# Patient Record
Sex: Female | Born: 1950 | Race: Black or African American | Hispanic: No | Marital: Single | Smoking: Former smoker
Health system: Southern US, Community
[De-identification: ages and names within clinical notes are randomized; demographics above are authoritative.]

## PROBLEM LIST (undated history)

## (undated) DIAGNOSIS — Z8619 Personal history of other infectious and parasitic diseases: Secondary | ICD-10-CM

## (undated) DIAGNOSIS — G4730 Sleep apnea, unspecified: Secondary | ICD-10-CM

## (undated) DIAGNOSIS — M150 Primary generalized (osteo)arthritis: Secondary | ICD-10-CM

## (undated) DIAGNOSIS — F418 Other specified anxiety disorders: Secondary | ICD-10-CM

## (undated) DIAGNOSIS — M25561 Pain in right knee: Secondary | ICD-10-CM

## (undated) DIAGNOSIS — D649 Anemia, unspecified: Secondary | ICD-10-CM

## (undated) DIAGNOSIS — F32A Depression, unspecified: Secondary | ICD-10-CM

## (undated) DIAGNOSIS — Z6841 Body Mass Index (BMI) 40.0 and over, adult: Secondary | ICD-10-CM

## (undated) DIAGNOSIS — H409 Unspecified glaucoma: Secondary | ICD-10-CM

## (undated) DIAGNOSIS — M159 Polyosteoarthritis, unspecified: Secondary | ICD-10-CM

## (undated) DIAGNOSIS — I509 Heart failure, unspecified: Secondary | ICD-10-CM

## (undated) DIAGNOSIS — F419 Anxiety disorder, unspecified: Secondary | ICD-10-CM

## (undated) DIAGNOSIS — F329 Major depressive disorder, single episode, unspecified: Secondary | ICD-10-CM

## (undated) DIAGNOSIS — M8949 Other hypertrophic osteoarthropathy, multiple sites: Secondary | ICD-10-CM

## (undated) DIAGNOSIS — R7301 Impaired fasting glucose: Secondary | ICD-10-CM

## (undated) DIAGNOSIS — I10 Essential (primary) hypertension: Secondary | ICD-10-CM

## (undated) DIAGNOSIS — M25562 Pain in left knee: Secondary | ICD-10-CM

## (undated) DIAGNOSIS — B182 Chronic viral hepatitis C: Secondary | ICD-10-CM

## (undated) HISTORY — DX: Pain in left knee: M25.561

## (undated) HISTORY — DX: Primary generalized (osteo)arthritis: M15.0

## (undated) HISTORY — DX: Morbid (severe) obesity due to excess calories: E66.01

## (undated) HISTORY — DX: Chronic viral hepatitis C: B18.2

## (undated) HISTORY — DX: Other specified anxiety disorders: F41.8

## (undated) HISTORY — DX: Anemia, unspecified: D64.9

## (undated) HISTORY — DX: Other hypertrophic osteoarthropathy, multiple sites: M89.49

## (undated) HISTORY — PX: BREAST BIOPSY: SHX20

## (undated) HISTORY — DX: Anxiety disorder, unspecified: F41.9

## (undated) HISTORY — DX: Pain in left knee: M25.562

## (undated) HISTORY — DX: Unspecified glaucoma: H40.9

## (undated) HISTORY — DX: Major depressive disorder, single episode, unspecified: F32.9

## (undated) HISTORY — DX: Impaired fasting glucose: R73.01

## (undated) HISTORY — DX: Depression, unspecified: F32.A

## (undated) HISTORY — DX: Polyosteoarthritis, unspecified: M15.9

## (undated) HISTORY — DX: Personal history of other infectious and parasitic diseases: Z86.19

## (undated) HISTORY — DX: Body Mass Index (BMI) 40.0 and over, adult: Z684

---

## 1997-06-06 DIAGNOSIS — I509 Heart failure, unspecified: Secondary | ICD-10-CM

## 1997-06-06 HISTORY — DX: Heart failure, unspecified: I50.9

## 1999-04-07 HISTORY — PX: ABDOMINAL HYSTERECTOMY: SHX81

## 2005-01-02 ENCOUNTER — Emergency Department: Payer: Self-pay | Admitting: Unknown Physician Specialty

## 2005-01-18 ENCOUNTER — Emergency Department: Payer: Self-pay | Admitting: Emergency Medicine

## 2005-01-19 ENCOUNTER — Emergency Department: Payer: Self-pay | Admitting: Emergency Medicine

## 2005-08-27 ENCOUNTER — Emergency Department: Payer: Self-pay | Admitting: Emergency Medicine

## 2005-11-23 ENCOUNTER — Ambulatory Visit: Payer: Self-pay

## 2006-03-08 ENCOUNTER — Other Ambulatory Visit: Payer: Self-pay

## 2006-03-08 ENCOUNTER — Ambulatory Visit: Payer: Self-pay

## 2006-06-19 ENCOUNTER — Emergency Department: Payer: Self-pay | Admitting: Emergency Medicine

## 2007-03-28 ENCOUNTER — Ambulatory Visit: Payer: Self-pay

## 2008-04-15 ENCOUNTER — Ambulatory Visit: Payer: Self-pay

## 2009-11-10 ENCOUNTER — Ambulatory Visit: Payer: Self-pay | Admitting: Orthopedic Surgery

## 2009-11-12 ENCOUNTER — Ambulatory Visit: Payer: Self-pay | Admitting: Orthopedic Surgery

## 2009-11-18 ENCOUNTER — Ambulatory Visit: Payer: Self-pay

## 2009-12-08 ENCOUNTER — Ambulatory Visit: Payer: Self-pay | Admitting: General Surgery

## 2009-12-17 ENCOUNTER — Ambulatory Visit: Payer: Self-pay | Admitting: General Surgery

## 2010-05-17 ENCOUNTER — Emergency Department: Payer: Self-pay | Admitting: Emergency Medicine

## 2010-12-14 ENCOUNTER — Ambulatory Visit: Payer: Self-pay

## 2011-04-05 DIAGNOSIS — I5022 Chronic systolic (congestive) heart failure: Secondary | ICD-10-CM | POA: Insufficient documentation

## 2011-06-20 ENCOUNTER — Ambulatory Visit: Payer: Self-pay

## 2011-06-28 ENCOUNTER — Ambulatory Visit: Payer: Self-pay

## 2011-07-28 ENCOUNTER — Ambulatory Visit: Payer: Self-pay | Admitting: General Surgery

## 2011-07-28 LAB — CBC WITH DIFFERENTIAL/PLATELET
Basophil #: 0 10*3/uL (ref 0.0–0.1)
Basophil %: 0.5 %
Eosinophil #: 0.3 10*3/uL (ref 0.0–0.7)
Eosinophil %: 4.7 %
HCT: 36.7 % (ref 35.0–47.0)
Lymphocyte #: 2 10*3/uL (ref 1.0–3.6)
Lymphocyte %: 35.1 %
MCHC: 30.6 g/dL — ABNORMAL LOW (ref 32.0–36.0)
MCV: 79 fL — ABNORMAL LOW (ref 80–100)
Monocyte %: 12.9 %
Neutrophil #: 2.6 10*3/uL (ref 1.4–6.5)
RBC: 4.66 10*6/uL (ref 3.80–5.20)
RDW: 18.4 % — ABNORMAL HIGH (ref 11.5–14.5)

## 2011-07-28 LAB — BASIC METABOLIC PANEL
Anion Gap: 12 (ref 7–16)
BUN: 24 mg/dL — ABNORMAL HIGH (ref 7–18)
Creatinine: 1.16 mg/dL (ref 0.60–1.30)
EGFR (African American): >60
EGFR (Non-African Amer.): 51 — ABNORMAL LOW
Glucose: 106 mg/dL — ABNORMAL HIGH (ref 65–99)
Osmolality: 288 (ref 275–301)
Potassium: 4.1 mmol/L (ref 3.5–5.1)

## 2011-08-09 ENCOUNTER — Ambulatory Visit: Payer: Self-pay | Admitting: General Surgery

## 2012-01-16 DIAGNOSIS — M1991 Primary osteoarthritis, unspecified site: Secondary | ICD-10-CM | POA: Insufficient documentation

## 2012-06-03 ENCOUNTER — Emergency Department: Payer: Self-pay | Admitting: Emergency Medicine

## 2012-06-03 LAB — BASIC METABOLIC PANEL
Calcium, Total: 9.3 mg/dL (ref 8.5–10.1)
Chloride: 107 mmol/L (ref 98–107)
Co2: 29 mmol/L (ref 21–32)
Creatinine: 1.12 mg/dL (ref 0.60–1.30)
Potassium: 4.1 mmol/L (ref 3.5–5.1)
Sodium: 142 mmol/L (ref 136–145)

## 2012-06-03 LAB — CBC
HCT: 36.6 % (ref 35.0–47.0)
HGB: 12.2 g/dL (ref 12.0–16.0)
MCHC: 33.2 g/dL (ref 32.0–36.0)
MCV: 87 fL (ref 80–100)
RDW: 16.3 % — ABNORMAL HIGH (ref 11.5–14.5)
WBC: 6.6 10*3/uL (ref 3.6–11.0)

## 2012-06-03 LAB — CK TOTAL AND CKMB (NOT AT ARMC): CK-MB: 1.1 ng/mL (ref 0.5–3.6)

## 2012-06-04 ENCOUNTER — Emergency Department: Payer: Self-pay | Admitting: Emergency Medicine

## 2012-06-04 LAB — CBC
HGB: 11.6 g/dL — ABNORMAL LOW (ref 12.0–16.0)
RBC: 4.18 10*6/uL (ref 3.80–5.20)

## 2012-06-04 LAB — BASIC METABOLIC PANEL
Chloride: 108 mmol/L — ABNORMAL HIGH (ref 98–107)
Co2: 29 mmol/L (ref 21–32)
EGFR (African American): 59 — ABNORMAL LOW

## 2012-06-04 LAB — TROPONIN I: Troponin-I: <0.02 ng/mL

## 2012-06-04 LAB — CK TOTAL AND CKMB (NOT AT ARMC): CK-MB: 0.6 ng/mL (ref 0.5–3.6)

## 2012-06-04 LAB — PRO B NATRIURETIC PEPTIDE: B-Type Natriuretic Peptide: 344 pg/mL — ABNORMAL HIGH (ref 0–125)

## 2012-10-09 ENCOUNTER — Ambulatory Visit: Payer: Self-pay

## 2012-10-30 DIAGNOSIS — J309 Allergic rhinitis, unspecified: Secondary | ICD-10-CM | POA: Insufficient documentation

## 2012-10-30 DIAGNOSIS — K219 Gastro-esophageal reflux disease without esophagitis: Secondary | ICD-10-CM | POA: Insufficient documentation

## 2012-11-14 ENCOUNTER — Emergency Department: Payer: Self-pay | Admitting: Emergency Medicine

## 2012-11-14 LAB — URINALYSIS, COMPLETE
Ketone: NEGATIVE
Nitrite: NEGATIVE
Specific Gravity: 1.021 (ref 1.003–1.030)

## 2012-11-14 LAB — BASIC METABOLIC PANEL
BUN: 16 mg/dL (ref 7–18)
Calcium, Total: 9.8 mg/dL (ref 8.5–10.1)
Chloride: 105 mmol/L (ref 98–107)
Co2: 29 mmol/L (ref 21–32)
Creatinine: 0.95 mg/dL (ref 0.60–1.30)
EGFR (African American): >60
Glucose: 110 mg/dL — ABNORMAL HIGH (ref 65–99)
Potassium: 4.5 mmol/L (ref 3.5–5.1)

## 2012-11-14 LAB — CBC
HGB: 12.3 g/dL (ref 12.0–16.0)
MCV: 81 fL (ref 80–100)
Platelet: 299 10*3/uL (ref 150–440)
RDW: 19.3 % — ABNORMAL HIGH (ref 11.5–14.5)
WBC: 5.6 10*3/uL (ref 3.6–11.0)

## 2013-01-03 ENCOUNTER — Ambulatory Visit: Payer: Self-pay | Admitting: Bariatrics

## 2013-01-03 LAB — COMPREHENSIVE METABOLIC PANEL
Alkaline Phosphatase: 77 U/L (ref 50–136)
Anion Gap: 3 — ABNORMAL LOW (ref 7–16)
BUN: 14 mg/dL (ref 7–18)
Bilirubin,Total: 0.3 mg/dL (ref 0.2–1.0)
Calcium, Total: 9.3 mg/dL (ref 8.5–10.1)
Chloride: 107 mmol/L (ref 98–107)
EGFR (Non-African Amer.): >60
Glucose: 101 mg/dL — ABNORMAL HIGH (ref 65–99)
Osmolality: 278 (ref 275–301)
SGOT(AST): 38 U/L — ABNORMAL HIGH (ref 15–37)
SGPT (ALT): 52 U/L (ref 12–78)

## 2013-01-03 LAB — BILIRUBIN, DIRECT: Bilirubin, Direct: 0.2 mg/dL (ref 0.00–0.20)

## 2013-01-03 LAB — MAGNESIUM: Magnesium: 1.8 mg/dL

## 2013-01-03 LAB — FERRITIN: Ferritin (ARMC): 74 ng/mL (ref 8–388)

## 2013-01-03 LAB — CBC WITH DIFFERENTIAL/PLATELET
Basophil %: 1 %
Eosinophil %: 5 %
HCT: 36.3 % (ref 35.0–47.0)
Lymphocyte #: 1.6 10*3/uL (ref 1.0–3.6)
Lymphocyte %: 31.7 %
MCH: 26.9 pg (ref 26.0–34.0)
MCHC: 32.9 g/dL (ref 32.0–36.0)
Monocyte #: 0.5 x10 3/mm (ref 0.2–0.9)
Monocyte %: 10.4 %
Neutrophil #: 2.7 10*3/uL (ref 1.4–6.5)
Platelet: 282 10*3/uL (ref 150–440)
RBC: 4.44 10*6/uL (ref 3.80–5.20)
RDW: 18.2 % — ABNORMAL HIGH (ref 11.5–14.5)
WBC: 5.1 10*3/uL (ref 3.6–11.0)

## 2013-01-03 LAB — FOLATE: Folic Acid: 13.4 ng/mL (ref 3.1–100.0)

## 2013-01-03 LAB — PROTIME-INR: Prothrombin Time: 13.3 secs (ref 11.5–14.7)

## 2013-01-03 LAB — IRON AND TIBC
Iron Bind.Cap.(Total): 386 ug/dL (ref 250–450)
Iron Saturation: 12 %
Iron: 48 ug/dL — ABNORMAL LOW (ref 50–170)
Unbound Iron-Bind.Cap.: 338 ug/dL

## 2013-01-03 LAB — PHOSPHORUS: Phosphorus: 3.3 mg/dL (ref 2.5–4.9)

## 2013-01-03 LAB — APTT: Activated PTT: 30 secs (ref 23.6–35.9)

## 2013-01-03 LAB — HEMOGLOBIN A1C: Hemoglobin A1C: 5.8 % (ref 4.2–6.3)

## 2013-01-14 ENCOUNTER — Other Ambulatory Visit: Payer: Self-pay | Admitting: Bariatrics

## 2013-01-14 DIAGNOSIS — K219 Gastro-esophageal reflux disease without esophagitis: Secondary | ICD-10-CM

## 2013-01-14 DIAGNOSIS — E669 Obesity, unspecified: Secondary | ICD-10-CM

## 2013-01-14 DIAGNOSIS — I10 Essential (primary) hypertension: Secondary | ICD-10-CM

## 2013-01-21 ENCOUNTER — Other Ambulatory Visit: Payer: Self-pay

## 2013-01-22 ENCOUNTER — Other Ambulatory Visit: Payer: Self-pay

## 2013-02-18 ENCOUNTER — Ambulatory Visit: Payer: Self-pay | Admitting: Bariatrics

## 2013-03-06 ENCOUNTER — Ambulatory Visit: Payer: Self-pay | Admitting: Bariatrics

## 2013-05-21 ENCOUNTER — Ambulatory Visit: Payer: Self-pay | Admitting: Gastroenterology

## 2013-05-27 LAB — PATHOLOGY REPORT

## 2013-06-01 ENCOUNTER — Emergency Department: Payer: Self-pay | Admitting: Emergency Medicine

## 2013-06-01 LAB — CBC
HCT: 35.3 % (ref 35.0–47.0)
HGB: 11.5 g/dL — ABNORMAL LOW (ref 12.0–16.0)
MCH: 26 pg (ref 26.0–34.0)
MCV: 80 fL (ref 80–100)
RBC: 4.44 10*6/uL (ref 3.80–5.20)

## 2013-06-02 LAB — COMPREHENSIVE METABOLIC PANEL
BUN: 18 mg/dL (ref 7–18)
Bilirubin,Total: 0.2 mg/dL (ref 0.2–1.0)
Calcium, Total: 9.3 mg/dL (ref 8.5–10.1)
Chloride: 103 mmol/L (ref 98–107)
Co2: 29 mmol/L (ref 21–32)
Creatinine: 1.4 mg/dL — ABNORMAL HIGH (ref 0.60–1.30)
EGFR (African American): 47 — ABNORMAL LOW
EGFR (Non-African Amer.): 41 — ABNORMAL LOW
Glucose: 107 mg/dL — ABNORMAL HIGH (ref 65–99)
Osmolality: 272 (ref 275–301)
Potassium: 4.5 mmol/L (ref 3.5–5.1)
SGOT(AST): 38 U/L — ABNORMAL HIGH (ref 15–37)
SGPT (ALT): 43 U/L (ref 12–78)
Sodium: 135 mmol/L — ABNORMAL LOW (ref 136–145)

## 2013-06-02 LAB — CK TOTAL AND CKMB (NOT AT ARMC): CK, Total: 242 U/L — ABNORMAL HIGH (ref 21–215)

## 2013-12-30 ENCOUNTER — Ambulatory Visit: Payer: Self-pay | Admitting: Gastroenterology

## 2014-09-28 NOTE — Op Note (Signed)
PATIENT NAME:  Angie ChampagneLONG, [name] MR#:  [id number] DATE OF BIRTH:  10/09/1950  DATE OF PROCEDURE:  08/09/2011  PREOPERATIVE DIAGNOSIS: Right breast microcalcifications.   POSTOPERATIVE DIAGNOSIS: Right breast microcalcifications.   OPERATION: Excisional biopsy right breast after wire localization with mammography.   SURGEON:  Kathreen CosierS. G. Kaleiah Kutzer, M.D.   ANESTHESIA: Monitored care with local anesthetic of  0.5% Marcaine mixed with 1% Xylocaine.   COMPLICATIONS: None.   ESTIMATED BLOOD LOSS: Less than 20 mL.   DRAINS: None.   DESCRIPTION OF PROCEDURE: The patient was placed in the supine position on the operating table. The right breast was prepped and draped out as a sterile field, taking care not to disturb the wire that was placed with mammography to localize an area of calcifications located close to the areola in the upper outer quadrant. A skin incision was mapped out below the level of the wire entrance placed slightly superior to the area of the lesion. A local anesthetic was then instilled and adequate block was obtained in this area. A skin incision was made and carefully deepened through into the subcutaneous tissue. The skin and subcutaneous tissue were elevated on both sides and the entrance of the wire to the glandular tissue was identified. The wire was freed from the skin. With the wire as a guide, dissection was then performed surrounding this until the thick part of the wire was encountered where the microcalcifications were identified. In here the glandular tissue took on a very hard feel, measuring about 3 to 4 cm in diameter. This entire area was excised out including a portion beyond the tip of the wire. After excision there was another portion located just lateral that was also felt to be somewhat hard. This was about 3 x 1-cm tissue and this was excised out and also sent with the main specimen to mammography. Specimen mammogram confirmed the presence of the microcalcifications and the  tissue was then sent to pathology. Hemostasis was obtained with cautery. The deeper tissues were then closed with 2-0 Vicryl in two layers, and the skin was approximated with subcuticular 4-0 Vicryl covered with Dermabond. The procedure was well tolerated. The patient subsequently was returned to the recovery room in stable condition.    ____________________________ S.[name]. [name], MD sgs:bjt D: 08/09/2011 16:20:50 ET T: 08/09/2011 16:52:17 ET JOB#: [id number]  cc: Timoteo ExposeS.G. Evette CristalSankar, MD, <Dictator> [facility] Center[facility]PL[facility] [name] Kariss Longmire MD ELECTRONICALLY SIGNED 08/10/2011 7:11

## 2014-12-22 ENCOUNTER — Other Ambulatory Visit: Payer: Self-pay | Admitting: Family Medicine

## 2014-12-22 ENCOUNTER — Telehealth: Payer: Self-pay | Admitting: Family Medicine

## 2014-12-22 NOTE — Telephone Encounter (Signed)
PT SAID THAT SHE NEEDS A NOTE ABOUT NOT BEING ABLE TO SERVE ON JURY DUTY. SHE SAID THAT SHE CAN NOT SET THAT [place] WITH HER FEET HANGING FOR THEY SWELL SO BAD PLUS SHE HAS TO WEAR DIAPERS AT ALL TIMES DUE TO LEAKAGE EVEN DOING NORMAL ACTI VITES.

## 2014-12-22 NOTE — Telephone Encounter (Signed)
PT WAS NOTIFIED AND WILL COME BY ON 12-23-14 TO PICK IT UP.

## 2014-12-22 NOTE — Telephone Encounter (Signed)
Requested letter printed out and ready for pick up please let patient know thank you.

## 2015-01-08 ENCOUNTER — Ambulatory Visit: Payer: Self-pay | Admitting: Family Medicine

## 2015-01-15 ENCOUNTER — Telehealth: Payer: Self-pay | Admitting: Family Medicine

## 2015-01-15 NOTE — Telephone Encounter (Signed)
You had written a letter on 12-22-14 for jury duty. She has received another letter asking asking additional information. Patient is requesting that you please add on the letter about her fluid pills and her having a walking and having to go to the restroom all the time. They are wanting her to serve jury duty on sept 26, 2016.  [phone]

## 2015-01-16 ENCOUNTER — Telehealth: Payer: Self-pay

## 2015-01-16 NOTE — Telephone Encounter (Signed)
Tried to contact this patient to inform her that the letter that was written on 12/22/14 should be sufficient and that we are not allowed to give out specifics regarding her medical history due to HIPAA, but there was no answer. A message was left for her to give [place] a call back when she got the chance.

## 2015-01-16 NOTE — Telephone Encounter (Signed)
If they have already declined accepting my initial letter to excuse her from jury duty there is not much more I can do to excuse her in her legal obligations.

## 2015-01-16 NOTE — Telephone Encounter (Signed)
Patient stated that the they mailed the letter back to her and the sheriff stated that it need to be specific why she cannot complete her time for jury duty. Patient was informed that we could not go into specifics due to HIPAA and that the letter should cover what they need and if they needed to hear that from [place] to give [place] a call. She said ok and thanks

## 2015-01-26 ENCOUNTER — Other Ambulatory Visit: Payer: Self-pay | Admitting: Family Medicine

## 2015-01-26 ENCOUNTER — Telehealth: Payer: Self-pay | Admitting: Family Medicine

## 2015-01-26 ENCOUNTER — Telehealth: Payer: Self-pay

## 2015-01-26 DIAGNOSIS — I10 Essential (primary) hypertension: Secondary | ICD-10-CM

## 2015-01-26 MED ORDER — LISINOPRIL 20 MG PO TABS: 20.0000 mg | ORAL_TABLET | Freq: Every day | ORAL | Status: DC

## 2015-01-26 NOTE — Telephone Encounter (Signed)
Pt would like a referral to Orthopedic dr at [facility]. Pt has Medicaid.

## 2015-01-26 NOTE — Telephone Encounter (Signed)
Rx sent 

## 2015-01-26 NOTE — Telephone Encounter (Signed)
Got a fax from [facility] requesting a 90 day supply of Lisinopril   Refill request was sent to Dr. Edwena Felty for approval and submission.  Lisinopril (  Tablet, Oral) Active. Recorded 10/07/2014 09:33 AM by Idelle Crouch, Office Visit.

## 2015-01-26 NOTE — Telephone Encounter (Signed)
Needs office visit.

## 2015-01-27 NOTE — Telephone Encounter (Signed)
LMOM to inform pt to call the office to schedule an appt °

## 2015-02-06 ENCOUNTER — Ambulatory Visit: Payer: Self-pay | Admitting: Family Medicine

## 2015-02-13 ENCOUNTER — Ambulatory Visit: Payer: Self-pay | Admitting: Family Medicine

## 2015-02-18 ENCOUNTER — Other Ambulatory Visit: Payer: Self-pay

## 2015-02-18 DIAGNOSIS — F4312 Post-traumatic stress disorder, chronic: Secondary | ICD-10-CM | POA: Insufficient documentation

## 2015-02-18 DIAGNOSIS — F419 Anxiety disorder, unspecified: Secondary | ICD-10-CM

## 2015-02-18 DIAGNOSIS — I10 Essential (primary) hypertension: Secondary | ICD-10-CM | POA: Insufficient documentation

## 2015-02-18 DIAGNOSIS — D649 Anemia, unspecified: Secondary | ICD-10-CM | POA: Insufficient documentation

## 2015-02-18 DIAGNOSIS — G4733 Obstructive sleep apnea (adult) (pediatric): Secondary | ICD-10-CM | POA: Insufficient documentation

## 2015-02-18 DIAGNOSIS — Z8619 Personal history of other infectious and parasitic diseases: Secondary | ICD-10-CM

## 2015-02-18 DIAGNOSIS — H409 Unspecified glaucoma: Secondary | ICD-10-CM | POA: Insufficient documentation

## 2015-02-18 DIAGNOSIS — F329 Major depressive disorder, single episode, unspecified: Secondary | ICD-10-CM | POA: Insufficient documentation

## 2015-02-18 DIAGNOSIS — T7840XA Allergy, unspecified, initial encounter: Secondary | ICD-10-CM | POA: Insufficient documentation

## 2015-02-18 DIAGNOSIS — Z789 Other specified health status: Secondary | ICD-10-CM | POA: Insufficient documentation

## 2015-02-18 DIAGNOSIS — M174 Other bilateral secondary osteoarthritis of knee: Secondary | ICD-10-CM | POA: Insufficient documentation

## 2015-02-18 DIAGNOSIS — M25569 Pain in unspecified knee: Secondary | ICD-10-CM | POA: Insufficient documentation

## 2015-02-18 DIAGNOSIS — Z6841 Body Mass Index (BMI) 40.0 and over, adult: Secondary | ICD-10-CM

## 2015-02-18 HISTORY — DX: Personal history of other infectious and parasitic diseases: Z86.19

## 2015-02-18 NOTE — Telephone Encounter (Signed)
Refill request was sent to Dr. Ashany Sundaram for approval and submission.  

## 2015-02-23 ENCOUNTER — Telehealth: Payer: Self-pay | Admitting: Family Medicine

## 2015-02-23 NOTE — Telephone Encounter (Signed)
She has only seen me once 10/07/14 to establish care. Medical Insurances require all new orders especially home care, home health, specialist referrals, etc to be documented in an office visit prior to the order being approved. MUST follow up with me.

## 2015-02-23 NOTE — Telephone Encounter (Signed)
A message was left for the patient stating that in order to get what she is requesting, which is the wheelchair, she will have to come in for an office visit, but there was no answer. A message was left for her stating that an office visit with Dr. Sherley Bounds will be the only way that an order for a wheelchair would be submitted and if she has any other questions to give [place] a call.

## 2015-02-23 NOTE — Telephone Encounter (Signed)
Pt is needing to have some assistance in her home until she can get her wheel chair.  Pt wanted to know what she needed to do in order to get assistance.

## 2015-02-23 NOTE — Telephone Encounter (Signed)
Patient called back inquiring about an order for a wheelchair. I informed her that you would like for her to be seen. She began crying and stated that her knees gave out on her Saturday and states that she is not mobile that she is not able to walk at all. Stated that she would come in but she cannot walk. She is pleading for your assistance. I told her that again what you said and that I would ask you again.

## 2015-02-23 NOTE — Telephone Encounter (Signed)
Needs office visit.

## 2015-02-24 ENCOUNTER — Telehealth: Payer: Self-pay

## 2015-02-24 NOTE — Telephone Encounter (Signed)
Patient called stating she just need a wheelchair so she can get an aid to help her get downstairs so she can get to her appointments in [place] for her shots. She stated that she has not been able to do anything doesn't have anyone to help her. I then informed her that with her insurance Medicaid and Medicare, they will not approve anything unless they have documentation and that is something we do not currently have on her, b/c she has not been seen since 2015. I went on to say that she could call the paramedics to see if they could get her downstairs and to her appts so that we can document everything and then get her the help that she needs but she was not trying to hear anything that I was saying b/c she kept repeating "you don't understand I don't have anyone to help me." She got upset and said, "ok," then hung up.

## 2015-02-25 ENCOUNTER — Other Ambulatory Visit: Payer: Self-pay | Admitting: Family Medicine

## 2015-02-25 MED ORDER — NYSTATIN 100000 UNIT/GM EX CREA: TOPICAL_CREAM | Freq: Two times a day (BID) | CUTANEOUS | Status: DC

## 2015-02-25 NOTE — Telephone Encounter (Signed)
PT NEEDS REFILL ON NYSTATIN. PT SAID THAT PHARM SAID THEY HAVE SENT IT OVER A COUPLE TIMES. PHARM IS [facility]

## 2015-02-25 NOTE — Telephone Encounter (Signed)
Refill request was sent to Dr. Ashany Sundaram for approval and submission.  

## 2015-03-02 ENCOUNTER — Telehealth: Payer: Self-pay

## 2015-03-02 NOTE — Telephone Encounter (Signed)
Patient called wanting to know if she was able to get someone to help her get into her car and she drove over here to [facility], would someone be able to help her get into our facility and I told her yes. She stated she would call when she got here.

## 2015-03-03 ENCOUNTER — Encounter: Payer: Self-pay | Admitting: Family Medicine

## 2015-03-03 ENCOUNTER — Ambulatory Visit (INDEPENDENT_AMBULATORY_CARE_PROVIDER_SITE_OTHER): Payer: Medicare Other | Admitting: Family Medicine

## 2015-03-03 VITALS — BP 122/68 | HR 68 | Temp 97.7°F | Resp 16 | Ht 64.0 in | Wt 316.9 lb

## 2015-03-03 DIAGNOSIS — M174 Other bilateral secondary osteoarthritis of knee: Secondary | ICD-10-CM

## 2015-03-03 DIAGNOSIS — R29898 Other symptoms and signs involving the musculoskeletal system: Secondary | ICD-10-CM

## 2015-03-03 DIAGNOSIS — I5022 Chronic systolic (congestive) heart failure: Secondary | ICD-10-CM | POA: Diagnosis not present

## 2015-03-03 DIAGNOSIS — Z23 Encounter for immunization: Secondary | ICD-10-CM | POA: Diagnosis not present

## 2015-03-03 DIAGNOSIS — Z6841 Body Mass Index (BMI) 40.0 and over, adult: Secondary | ICD-10-CM | POA: Diagnosis not present

## 2015-03-03 DIAGNOSIS — I10 Essential (primary) hypertension: Secondary | ICD-10-CM

## 2015-03-03 MED ORDER — WHEELCHAIR MISC: 1.0000 | Freq: Every day | Status: AC

## 2015-03-03 NOTE — Progress Notes (Addendum)
Name: Angie Webb   MRN: [id number]    DOB: [birth year]   Date:03/03/2015       Progress Note  Subjective  Chief Complaint  Chief Complaint  Patient presents with  . Advice Only    patient is here to see if she can get motorized wheelchair and home health care aide  . Medication Refill    HPI  Angie Webb is a 64 year old female who is fairly new to my practice who is here today to discuss her ongoing issues with weakness in her legs due to chronic pain and instability of her knees. She reports since about 1 month ago she has had worsening knee instability which has rendered her incapable of using her walker any more. She reports no overt falls but has been scooting around her home in a office chair with wheels. She would like a home wheel chair to help propel her self around. Angie Webb reports no weakness in her upper extremities. She denies numbness, tingling or focal neurological deficits. In the past she has consulted with [facility] specialists who have advised her on weight loss prior to much need knee replacement surgery. 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 Recently she saw a commercial on TV for [employer] services in [place] where she is planning to go to in order to get knee injections that may help build up some cushion in her knees. She needs me to fill out a transportation form which will be faxed to me. Otherwise she reports being able to clean her home, cook, sweep, drove here today. But she would like assistance with tasks such as laundry, taking garbage out and checking mail as she is restricted in her mobility outside of the home.   First diagnosed with hypertension several years ago. Current anti-hypertension medication regimen includes dietary modification, weight management and Labetalol 100 mg a day, amlodipine 10 mg a day, clonidine 0.2 mg twice a day, lisinopril 20 mg a day, Torsemide 20  mg a day.  Patient is following physician recommended management. Not checking blood pressure outside of physician office. Associated symptoms do not include headache, dizziness, nausea, lower extremity swelling, worsening shortness of breath, chest pain, numbness. Associated medical conditions include Congestive Heart Failure, systolic, Class II, currently not in acute failure.   Active Ambulatory Problems    Diagnosis Date Noted  . Absolute anemia 02/18/2015  . Chronic hepatitis C 02/18/2015  . Anxiety and depression 02/18/2015  . Glaucoma 02/18/2015  . BP (high blood pressure) 02/18/2015  . Morbid obesity with BMI of 50.0-59.9, adult 02/18/2015  . Obstructive sleep apnea of adult 02/18/2015  . Chronic post-traumatic stress disorder 02/18/2015  . Other bilateral secondary osteoarthritis of knee 02/18/2015  . Allergic rhinitis 10/30/2012  . Chronic diastolic heart failure 04/05/2011  . Clinical depression 02/27/2009  . Acid reflux 10/30/2012  . Obstructive apnea 02/27/2009  . Idiopathic localized osteoarthropathy 01/16/2012  . Need for immunization against influenza 03/03/2015  . Weakness of both lower extremities 03/03/2015   Resolved Ambulatory Problems    Diagnosis Date Noted  . Gravida 2 para 2 02/18/2015  . Allergic state 02/18/2015  . H/O measles 02/18/2015  . Gonalgia 02/18/2015   Past Medical History  Diagnosis Date  . Chronic hepatitis C without hepatic coma   . Anemia   . Depression with anxiety   . Depression   . Anxiety   . Primary  osteoarthritis involving multiple joints   . Bilateral knee pain     Social History  Substance Use Topics  . Smoking status: Former Games developer  . Smokeless tobacco: Not on file     Comment: quit 2007 after 32yrs.  . Alcohol Use: No     Current outpatient prescriptions:  .  amLODipine (NORVASC) 10 MG tablet, , Disp: , Rfl:  .  celecoxib (CELEBREX) 200 MG capsule, Take 200 mg by mouth., Disp: , Rfl:  .  cloNIDine (CATAPRES) 0.2 MG  tablet, Take 1 tablet by mouth 2 (two) times daily., Disp: , Rfl:  .  fluticasone (FLONASE) 50 MCG/ACT nasal spray, 1 spray by Each Nare route daily., Disp: , Rfl:  .  labetalol (NORMODYNE) 100 MG tablet, Take by mouth., Disp: , Rfl:  .  latanoprost (XALATAN) 0.005 % ophthalmic solution, Apply to eye., Disp: , Rfl:  .  lisinopril (PRINIVIL,ZESTRIL) 20 MG tablet, Take 1 tablet (20 mg total) by mouth daily., Disp: 90 tablet, Rfl: 1 .  nystatin cream (MYCOSTATIN), Apply topically 2 (two) times daily., Disp: 30 g, Rfl: 3 .  omeprazole (PRILOSEC) 20 MG capsule, , Disp: , Rfl:  .  timolol (TIMOPTIC) 0.5 % ophthalmic solution, , Disp: , Rfl:  .  torsemide (DEMADEX) 20 MG tablet, , Disp: , Rfl:  .  Misc. Devices [facility]) MISC, 1 Device by Does not apply route daily., Disp: 1 each, Rfl: 0  Past Surgical History  Procedure Laterality Date  . Breast biopsy  6/11 & 6/13  . Abdominal hysterectomy  04/1999    total    Family History  Problem Relation Age of Onset  . Cancer Father     Allergies  Allergen Reactions  . Bee Venom     Bees     Review of Systems  CONSTITUTIONAL: No significant weight changes, fever, chills, weakness or fatigue.  HEENT:  - Eyes: No visual changes.  - Ears: No auditory changes. No pain.  - Nose: No sneezing, congestion, runny nose. - Throat: No sore throat. No changes in swallowing. SKIN: No rash or itching.  CARDIOVASCULAR: No chest pain, chest pressure or chest discomfort. No palpitations or edema.  RESPIRATORY: No shortness of breath, cough or sputum.  GASTROINTESTINAL: No anorexia, nausea, vomiting. No changes in bowel habits. No abdominal pain or blood.  GENITOURINARY: No dysuria. No frequency. No discharge.  NEUROLOGICAL: No headache, dizziness, syncope, paralysis, ataxia, numbness or tingling in the extremities. No memory changes. No change in bowel or bladder control.  MUSCULOSKELETAL: Chronic joint pain. No muscle pain. HEMATOLOGIC: No anemia,  bleeding or bruising.  LYMPHATICS: No enlarged lymph nodes.  PSYCHIATRIC: No change in mood. No change in sleep pattern.  ENDOCRINOLOGIC: No reports of sweating, cold or heat intolerance. No polyuria or polydipsia.     Objective  BP 122/68 mmHg  Pulse 68  Temp(Src) 97.7 F (36.5 C) (Oral)  Resp 16  Wt 316 lb 14.4 oz (143.745 kg)  SpO2 97% Body mass index is 54.37 kg/(m^2).   Filed Vitals:   03/03/15 0828  Height:  (1.626 m)  Weight: 316 lb 14.4 oz (143.745 kg)     Physical Exam  Constitutional: Patient is morbidly obese, sitting in large wheel chair and well-nourished. In no distress.  HEENT:  - Head: Normocephalic and atraumatic.  - Ears: Bilateral TMs gray, no erythema or effusion - Nose: Nasal mucosa moist - Mouth/Throat: Oropharynx is clear and moist. No tonsillar hypertrophy or erythema. No post nasal drainage.  -  Eyes: Conjunctivae clear, EOM movements normal. PERRLA. No scleral icterus.  Neck: Normal range of motion. Neck supple. No JVD present. No thyromegaly present.  Cardiovascular: Normal rate, regular rhythm and normal heart sounds.  No murmur heard.  Pulmonary/Chest: Effort normal and breath sounds normal. No respiratory distress. Musculoskeletal: Normal range of motion bilateral UE. Bilateral LE with restricted ROM at bilateral hips due to body habitus, bilateral knees with notable crepitus and some laxity in left knee with no joint effusion. Unable to assess gait as patient can not take many steps due to instability. Strength UE bilateral 5/5. Strength LE bilateral 5/5.  Peripheral vascular: Bilateral LE trace pitting edema. Neurological: CN II-XII grossly intact with no focal deficits. Alert and oriented to person, place, and time. Poor balance. Skin: Skin is warm and dry. No rash noted. No erythema.  Psychiatric: Patient has a normal mood and affect. Behavior is normal in office today. Judgment and thought content normal in office today.   Assessment  & Plan  1. Weakness of both lower extremities Due to body habitus and [place] standing issues with knees. No neurological deficits identified today. Would benefit from wheel chair to reduce risk of traumatic fall.  - Misc. Devices [facility]) MISC; 1 Device by Does not apply route daily.  Dispense: 1 each; Refill: 0 - Ambulatory referral to Home Health [facility])  2. Morbid obesity with BMI of 50.0-59.9, adult The patient has been counseled on their higher than normal BMI.  They have verbally expressed understanding their increased risk for other diseases.  In efforts to meet a better target BMI goal the patient has been counseled on lifestyle, diet and exercise modification tactics. Start with moderate intensity aerobic exercise (walking, jogging, elliptical, swimming, group or individual sports, hiking) at least a day at least 4 days a week and increase intensity, duration, frequency as tolerated. Diet should include well balance fresh fruits and vegetables avoiding processed foods, carbohydrates and sugars. Drink at least 8oz 10 glasses a day avoiding sodas, sugary fruit drinks, sweetened tea. Check weight on a reliable scale daily and monitor weight loss progress daily. Consider investing in mobile phone apps that will help keep track of weight loss goals.  - Misc. Devices [facility]) MISC; 1 Device by Does not apply route daily.  Dispense: 1 each; Refill: 0  3. Other bilateral secondary osteoarthritis of knee Markos standing problem. May need to go back and consult with Ortho in the near future.  - Misc. Devices [facility]) MISC; 1 Device by Does not apply route daily.  Dispense: 1 each; Refill: 0  4. Hypertension goal BP (blood pressure) < 140/90 Clinically stable findings based on clinical exam and on review of any pertinent results. Recommended to patient that they continue their current regimen with regular follow ups.   5. Chronic systolic congestive heart failure, NYHA class  2 Clinically stable findings based on clinical exam and on review of any pertinent results. Recommended to patient that they continue their current regimen with regular follow ups.  6. Need for immunization against influenza

## 2015-03-09 ENCOUNTER — Telehealth: Payer: Self-pay | Admitting: Family Medicine

## 2015-03-09 NOTE — Telephone Encounter (Signed)
Completed paperwork was already faxed and mailed to her co-worker.

## 2015-03-09 NOTE — Telephone Encounter (Signed)
Patient is returning your call.  

## 2015-03-10 ENCOUNTER — Encounter: Payer: Self-pay | Admitting: Family Medicine

## 2015-03-16 ENCOUNTER — Other Ambulatory Visit: Payer: Self-pay | Admitting: Family Medicine

## 2015-06-03 ENCOUNTER — Ambulatory Visit: Payer: Medicare Other | Admitting: Family Medicine

## 2015-07-02 ENCOUNTER — Other Ambulatory Visit: Payer: Self-pay | Admitting: Internal Medicine

## 2015-07-02 DIAGNOSIS — I509 Heart failure, unspecified: Secondary | ICD-10-CM

## 2015-07-03 ENCOUNTER — Ambulatory Visit: Payer: Medicare Other | Admitting: Family Medicine

## 2015-07-08 ENCOUNTER — Encounter
Admission: RE | Admit: 2015-07-08 | Discharge: 2015-07-08 | Disposition: A | Discharge status: Home / Self Care | Payer: Medicare Other | Source: Ambulatory Visit | Attending: Internal Medicine | Admitting: Internal Medicine

## 2015-07-08 DIAGNOSIS — I509 Heart failure, unspecified: Secondary | ICD-10-CM

## 2015-07-08 HISTORY — DX: Heart failure, unspecified: I50.9

## 2015-07-08 HISTORY — DX: Essential (primary) hypertension: I10

## 2015-07-08 MED ORDER — REGADENOSON 0.4 MG/5ML IV SOLN: 0.4000 mg | Freq: Once | INTRAVENOUS | Status: DC

## 2015-07-08 MED ORDER — TECHNETIUM TC 99M SESTAMIBI - CARDIOLITE
32.3070 | Freq: Once | INTRAVENOUS | Status: AC | PRN
  Administered 2015-07-08: 10:00:00 32.307 via INTRAVENOUS

## 2015-07-08 MED ORDER — REGADENOSON 0.4 MG/5ML IV SOLN
0.4000 mg | Freq: Once | INTRAVENOUS | Status: AC
  Administered 2015-07-08: 0.4 mg via INTRAVENOUS

## 2015-07-09 ENCOUNTER — Encounter
Admission: RE | Admit: 2015-07-09 | Discharge: 2015-07-09 | Disposition: A | Discharge status: Home / Self Care | Payer: Medicare Other | Source: Ambulatory Visit | Attending: Internal Medicine | Admitting: Internal Medicine

## 2015-07-09 MED ORDER — TECHNETIUM TC 99M SESTAMIBI - CARDIOLITE
31.6000 | Freq: Once | INTRAVENOUS | Status: AC | PRN
  Administered 2015-07-09: 09:00:00 31.6 via INTRAVENOUS

## 2015-07-16 ENCOUNTER — Other Ambulatory Visit: Payer: Self-pay | Admitting: Family Medicine

## 2015-07-21 ENCOUNTER — Ambulatory Visit: Payer: Medicare Other | Admitting: Family Medicine

## 2015-07-29 LAB — NM MYOCAR MULTI W/SPECT W/WALL MOTION / EF
CHL CUP NUCLEAR SDS: 1
CHL CUP RESTING HR STRESS: 75 {beats}/min
CSEPED: 1 min
CSEPEW: 1 METS
CSEPPHR: 99 {beats}/min
Exercise duration (sec): 7 s
LV dias vol: 100 mL
LVSYSVOL: 39 mL
SRS: 7
SSS: 2
TID: 0.95

## 2015-08-05 ENCOUNTER — Other Ambulatory Visit: Payer: Self-pay | Admitting: Family Medicine

## 2015-09-01 ENCOUNTER — Telehealth: Payer: Self-pay | Admitting: Family Medicine

## 2015-09-01 ENCOUNTER — Other Ambulatory Visit: Payer: Self-pay

## 2015-09-01 MED ORDER — CLONIDINE HCL 0.2 MG PO TABS: 0.2000 mg | ORAL_TABLET | Freq: Three times a day (TID) | ORAL | Status: DC

## 2015-09-01 MED ORDER — LABETALOL HCL 100 MG PO TABS
100.0000 mg | ORAL_TABLET | Freq: Two times a day (BID) | ORAL | Status: DC
Start: 2015-09-01 — End: 2016-01-26

## 2015-09-01 MED ORDER — CLONIDINE HCL 0.2 MG PO TABS: 0.2000 mg | ORAL_TABLET | Freq: Two times a day (BID) | ORAL | Status: DC

## 2015-09-01 NOTE — Telephone Encounter (Signed)
Patient thanks you for refilling her clonidine medication however she states that it was filled incorrectly. [facility] and you prescribed it for twice daily.

## 2015-09-01 NOTE — Telephone Encounter (Signed)
I didn't actually take care of this refill; it was approved by another provider earlier today I'm not sure why the message was sent to me (?) I called the home number and left detailed message that I understand that a medicine was sent in for her by another provider and that it needs to be updated I'll be glad to change the instructions and send a new Rx

## 2015-09-07 ENCOUNTER — Telehealth: Payer: Self-pay | Admitting: Family Medicine

## 2015-09-07 NOTE — Telephone Encounter (Signed)
PT WANTS RESULTS OF MAMMO WHEN IT COMES IN. PLEASE CALL HER AT [phone]

## 2015-09-09 NOTE — Telephone Encounter (Signed)
Per jamie does not see that we ordered a mammo nor do we have the results. Called and lft message to call and let us know who ordered it and where she went to have the test.

## 2015-09-25 ENCOUNTER — Other Ambulatory Visit: Payer: Self-pay

## 2015-09-25 MED ORDER — NYSTATIN 100000 UNIT/GM EX CREA: TOPICAL_CREAM | Freq: Two times a day (BID) | CUTANEOUS | Status: DC

## 2015-09-30 ENCOUNTER — Ambulatory Visit (INDEPENDENT_AMBULATORY_CARE_PROVIDER_SITE_OTHER): Payer: Medicare Other | Admitting: Family Medicine

## 2015-09-30 ENCOUNTER — Encounter: Payer: Self-pay | Admitting: Family Medicine

## 2015-09-30 VITALS — BP 128/84 | HR 82 | Temp 98.0°F | Resp 14 | Ht 64.0 in | Wt 331.6 lb

## 2015-09-30 DIAGNOSIS — K219 Gastro-esophageal reflux disease without esophagitis: Secondary | ICD-10-CM | POA: Diagnosis not present

## 2015-09-30 DIAGNOSIS — I10 Essential (primary) hypertension: Secondary | ICD-10-CM

## 2015-09-30 DIAGNOSIS — Z8619 Personal history of other infectious and parasitic diseases: Secondary | ICD-10-CM

## 2015-09-30 DIAGNOSIS — Z6841 Body Mass Index (BMI) 40.0 and over, adult: Secondary | ICD-10-CM | POA: Diagnosis not present

## 2015-09-30 DIAGNOSIS — R635 Abnormal weight gain: Secondary | ICD-10-CM | POA: Diagnosis not present

## 2015-09-30 DIAGNOSIS — D649 Anemia, unspecified: Secondary | ICD-10-CM | POA: Diagnosis not present

## 2015-09-30 DIAGNOSIS — G4733 Obstructive sleep apnea (adult) (pediatric): Secondary | ICD-10-CM

## 2015-09-30 DIAGNOSIS — Z1211 Encounter for screening for malignant neoplasm of colon: Secondary | ICD-10-CM | POA: Diagnosis not present

## 2015-09-30 DIAGNOSIS — Z5181 Encounter for therapeutic drug level monitoring: Secondary | ICD-10-CM | POA: Diagnosis not present

## 2015-09-30 DIAGNOSIS — M174 Other bilateral secondary osteoarthritis of knee: Secondary | ICD-10-CM | POA: Diagnosis not present

## 2015-09-30 DIAGNOSIS — I5022 Chronic systolic (congestive) heart failure: Secondary | ICD-10-CM | POA: Diagnosis not present

## 2015-09-30 NOTE — Assessment & Plan Note (Addendum)
Check CBC; patient does not know etiology; further work-up after labs resulted; ordering cologuard for screening purposes anyway; if significant microcytic hypochromic anemia, may still need EGD/colonoscopy if not recently done

## 2015-09-30 NOTE — Patient Instructions (Addendum)
  Your goal blood pressure is less than 130 mmHg on top. Try to follow the DASH guidelines (DASH stands for Dietary Approaches to Stop Hypertension) Try to limit the sodium in your diet.  Ideally, consume less than 1.5 grams (less than 1,500mg ) per day. Do not add salt when cooking or at the table.  Check the sodium amount on labels when shopping, and choose items lower in sodium when given a choice. Avoid or limit foods that already contain a lot of sodium. Eat a diet rich in fruits and vegetables and whole grains.  Check out the information at familydoctor.org entitled "What It Takes to Lose Weight" Try to lose between 1-2 pounds per week by taking in fewer calories and burning off more calories You can succeed by limiting portions, limiting foods dense in calories and fat, becoming more active, and drinking 8 glasses of water a day (64 ounces) Don't skip meals, especially breakfast, as skipping meals may alter your metabolism Do not use over-the-counter weight loss pills or gimmicks that claim rapid weight loss A healthy BMI (or body mass index) is between 18.5 and 24.9 You can calculate your ideal BMI at the NIH website [employer].huhttp://www.nhlbi.nih.gov/health/educational/lose_wt/BMI/bmicalc.htm  Please have fasting labs today and return in 4 weeks for weight loss strategies  We'll order the Cologuard  If you have not heard anything from my staff in a week about any orders/referrals/studies from today, please contact us here to follow-up [phone]

## 2015-09-30 NOTE — Assessment & Plan Note (Signed)
Improved with decrese in tea

## 2015-09-30 NOTE — Assessment & Plan Note (Signed)
Glad she is benefitting from her CPAP

## 2015-09-30 NOTE — Assessment & Plan Note (Signed)
Check creatinine and electrolytes on current meds

## 2015-09-30 NOTE — Assessment & Plan Note (Addendum)
Well-controlled on current medication; DASH guidelines; weight loss may help patient be able to come off of some medicines in the future; I would love to see her lose 50-100 pounds over the next few years

## 2015-09-30 NOTE — Assessment & Plan Note (Signed)
Pleased to hear that patient completed treatment

## 2015-09-30 NOTE — Assessment & Plan Note (Signed)
15 pound weight gain over last 7 months, but she does not appear fluid-overloaded today; check thyroid

## 2015-09-30 NOTE — Progress Notes (Signed)
BP 128/84 mmHg  Pulse 82  Temp(Src) 98 F (36.7 C) (Oral)  Resp 14  Ht 5\' 4"  (1.626 m)  Wt 331 lb 9.6 oz (150.413 kg)  BMI 56.89 kg/m2  SpO2 95%   Subjective:    Patient ID: Angie Webb, female    DOB: 04/11/1951, 65 y.o.   MRN: [id number]  HPI: Angie Webb is a 65 y.o. female  Chief Complaint  Patient presents with  . Annual Exam    interested in cologuard   Patient is new to me today, as her previous provider has left this practice  She has arthritis in her knees; the right knee went completely out last year; OA in both knees, "bone on bone"; she gained 26 pounds since November; she had lost down, then gained again; she says she knows that losing weight would help her knees; doing protein shakes; no sugar and no bread in her house; it helped her to lose weight walking up and down the hall, just walking on her floor, trying to be active; [facility], they gave her injections in her knees; went from a wheelchair to a walker[facility] [place]; they want her to get under 250 pounds before surgery  Hx of hepatitis C; she was treated with Harvoni for 8 weeks last year and the Hepatitis C is GONE she is pleased to report; she has a hx of drug abuse and is thankful to be alive, as many of her previous colleagues have died  CHF; sees cardiologist, Dr. Dorothyann Pengwayne Callwood; tries to avoid salt  Anemia; she does not know the cause; in the problem list  Vitamin D level is always low she says  OSA; wears mask every night  HTN; well-controlled today  Acid reflux has been okay; started limiting tea and that has been helping  Depression screen [facility] 2/9 09/30/2015 03/03/2015  Decreased Interest 0 0  Down, Depressed, Hopeless 0 1  PHQ - 2 Score 0 1   Relevant past medical, surgical, family and social history reviewed and updated as indicated. Past Medical History  Diagnosis Date  . Chronic hepatitis C without hepatic coma (HCC)   . Anemia   . Morbid  obesity with BMI of 50.0-59.9, adult (HCC)   . Depression with anxiety   . Glaucoma   . Depression   . Anxiety   . Primary osteoarthritis involving multiple joints   . Bilateral knee pain   . CHF (congestive heart failure) (HCC) 1999    Pt indicated hx of CHF since 1999  . Hypertension     Pt indicated hx of hypertension  . Hx of hepatitis C 02/18/2015    Overview:  Started on Harvoni 03/26/14. HepC GT 1a. Viral Load 390K. Liver elastrography: F0. SVR at 24 weeks (resolved infection) [facility]lycia Rossettiyan. S/P Harvoni treatments 2015-2016. History of IV drug use, illicit drug use.    Past Surgical History  Procedure Laterality Date  . Breast biopsy  6/11 & 6/13  . Abdominal hysterectomy  04/1999    total   Family History  Problem Relation Age of Onset  . Cancer Father   Lots of diabetes in the family; father had leukemia  Interim medical history since last visit reviewed. Allergies and medications reviewed; allergies not marked as checked by staff, but bee venom is all that is listed, and no new medicines were prescribed today  Review of Systems Per HPI unless specifically indicated above  Objective:    BP 128/84 mmHg  Pulse 82  Temp(Src) 98 F (36.7 C) (Oral)  Resp 14  Ht  (1.626 m)  Wt 331 lb 9.6 oz (150.413 kg)  BMI 56.89 kg/m2  SpO2 95%  Wt Readings from Last 3 Encounters:  09/30/15 331 lb 9.6 oz (150.413 kg)  03/03/15 316 lb 14.4 oz (143.745 kg)  10/07/14 328 lb (148.78 kg)    Physical Exam  Constitutional: She appears well-developed and well-nourished. No distress.  Morbidly obese; weight gain of 15 pounds over last 7 months  HENT:  Head: Normocephalic and atraumatic.  Eyes: EOM are normal. No scleral icterus.  Neck: No thyromegaly present.  Cardiovascular: Normal rate, regular rhythm and normal heart sounds.   No murmur heard. Pulmonary/Chest: Effort normal and breath sounds normal. No respiratory distress. She has no wheezes.    Abdominal: Soft. Bowel sounds are normal. She exhibits no distension.  Musculoskeletal: Normal range of motion. She exhibits no edema.  Neurological: She is alert. She exhibits normal muscle tone.  Skin: Skin is warm and dry. She is not diaphoretic. No pallor.  Psychiatric: She has a normal mood and affect. Her behavior is normal. Judgment and thought content normal.   Results for orders placed or performed during the hospital encounter of 07/08/15  NM Myocar Multi W/Spect W/Wall Motion / EF  Result Value Ref Range   Rest HR 75 bpm   Rest BP 157/70 mmHg   Exercise duration (min) 1 min   Exercise duration (sec) 7 sec   Estimated workload 1.0 METS   Peak HR 99 bpm   Peak BP 241/80 mmHg   MPHR  bpm   Percent HR  %   RPE     LV sys vol 39 mL   TID 0.95    LV dias vol 100 mL   LHR     SSS 2    SRS 7    SDS 1       Assessment & Plan:   Problem List Items Addressed This Visit      Cardiovascular and Mediastinum   Hypertension goal BP (blood pressure) < 140/90 - Primary    Well-controlled on current medication; DASH guidelines; weight loss may help patient be able to come off of some medicines in the future; I would love to see her lose 50-100 pounds over the next few years      Chronic systolic congestive heart failure, NYHA class 2 (HCC)    Managed by cardiologist; avoid salt; continue ACE-I and diuretic and beta-blocker        Respiratory   Obstructive sleep apnea of adult    Glad she is benefitting from her CPAP        Digestive   Acid reflux    Improved with decrese in tea        Musculoskeletal and Integument   Other bilateral secondary osteoarthritis of knee    Work on weight loss; referral to pain      Relevant Orders   Comprehensive metabolic panel     Other   Absolute anemia    Check CBC; patient does not know etiology; further work-up after labs resulted; ordering cologuard for screening purposes anyway; if significant microcytic hypochromic  anemia, may still need EGD/colonoscopy if not recently done      Relevant Orders   CBC with Differential/Platelet   Hx of hepatitis C    Pleased to hear that patient completed treatment  Morbid obesity with BMI of 50.0-59.9, adult (HCC)    Refer to medical nutrition therapy and then return in 4 weeks to discuss weight loss strategies including injection or pills; I would love to see her lose 50-100 pounds over the next couple of years; with her heart failure, I don't think Belviq would be a good 1st option; could consider Saxenda      Relevant Orders   Lipid Panel w/o Chol/HDL Ratio   Amb ref to Medical Nutrition Therapy-MNT   Abnormal weight gain    15 pound weight gain over last 7 months, but she does not appear fluid-overloaded today; check thyroid      Relevant Orders   TSH   Medication monitoring encounter    Check creatinine and electrolytes on current meds       Other Visit Diagnoses    Colon cancer screening        Relevant Orders    Cologuard      Follow up plan: Return in about 4 weeks (around 10/28/2015) for obesity.  An after-visit summary was printed and given to the patient at check-out.  Please see the patient instructions which may contain other information and recommendations beyond what is mentioned above in the assessment and plan.  Orders Placed This Encounter  Procedures  . CBC with Differential/Platelet  . Comprehensive metabolic panel  . Lipid Panel w/o Chol/HDL Ratio  . Cologuard  . TSH  . Amb ref to Medical Nutrition Therapy-MNT

## 2015-09-30 NOTE — Assessment & Plan Note (Addendum)
Refer to medical nutrition therapy and then return in 4 weeks to discuss weight loss strategies including injection or pills; I would love to see her lose 50-100 pounds over the next couple of years; with her heart failure, I don't think Belviq would be a good 1st option; could consider Saxenda

## 2015-09-30 NOTE — Assessment & Plan Note (Signed)
Managed by cardiologist; avoid salt; continue ACE-I and diuretic and beta-blocker

## 2015-09-30 NOTE — Assessment & Plan Note (Signed)
Work on weight loss; referral to pain

## 2015-10-13 ENCOUNTER — Telehealth: Payer: Self-pay | Admitting: Family Medicine

## 2015-10-13 DIAGNOSIS — R7301 Impaired fasting glucose: Secondary | ICD-10-CM

## 2015-10-13 DIAGNOSIS — D508 Other iron deficiency anemias: Secondary | ICD-10-CM

## 2015-10-13 DIAGNOSIS — Z1211 Encounter for screening for malignant neoplasm of colon: Secondary | ICD-10-CM

## 2015-10-13 NOTE — Telephone Encounter (Signed)
Pt would like results of her labs

## 2015-10-15 ENCOUNTER — Encounter: Payer: Self-pay | Admitting: Family Medicine

## 2015-10-15 DIAGNOSIS — R7301 Impaired fasting glucose: Secondary | ICD-10-CM | POA: Insufficient documentation

## 2015-10-15 DIAGNOSIS — Z1211 Encounter for screening for malignant neoplasm of colon: Secondary | ICD-10-CM | POA: Insufficient documentation

## 2015-10-15 DIAGNOSIS — D508 Other iron deficiency anemias: Secondary | ICD-10-CM | POA: Insufficient documentation

## 2015-10-15 HISTORY — DX: Impaired fasting glucose: R73.01

## 2015-10-15 NOTE — Telephone Encounter (Signed)
Left voice mail

## 2015-10-15 NOTE — Telephone Encounter (Signed)
I did not know she had called until just now; I apologize she has waited so [place] to get lab results, but they never crossed over in the computer Thank you, Asher MuirJamie, for getting me the results Please let patient know that her cholesterol panel is very good; total is 153; HDL is 56, LDL is 79 Glucose is a little above normal, "prediabetes" range if fasting; we'll watch that every 6 months; limit white bread, sugary drinks Kidney function is a little below normal, so avoid NSAIDs and stay on her current medicines Her red blood cells are small and pale, so we'd like her to get a colonoscopy to see if she is losing blood through her GI tract Recheck CBC in one month

## 2015-10-15 NOTE — Telephone Encounter (Signed)
Pt called again asking for results of her labs. Please advise.

## 2015-10-15 NOTE — Telephone Encounter (Signed)
Please review patient labs.  I had to call [facility] to get the results, they are on your desk.

## 2015-10-29 ENCOUNTER — Ambulatory Visit: Payer: Medicare Other | Admitting: Family Medicine

## 2015-11-05 ENCOUNTER — Encounter: Payer: Self-pay | Admitting: Family Medicine

## 2015-11-16 ENCOUNTER — Ambulatory Visit: Payer: Medicare Other | Admitting: Family Medicine

## 2015-11-26 ENCOUNTER — Ambulatory Visit: Payer: Medicare Other | Admitting: Family Medicine

## 2015-11-27 ENCOUNTER — Other Ambulatory Visit: Payer: Self-pay

## 2015-11-27 MED ORDER — NYSTATIN 100000 UNIT/GM EX CREA: TOPICAL_CREAM | Freq: Two times a day (BID) | CUTANEOUS | Status: DC

## 2015-11-30 ENCOUNTER — Telehealth: Payer: Self-pay | Admitting: Family Medicine

## 2015-11-30 NOTE — Telephone Encounter (Signed)
Patient has cancelled 3 appointments in a row Patient did not return Cologuard I'm not sure what our office policy is here about cancellations, so I'm forwarding this to [place] I'm happy to see her if she wishes to remain a patient here

## 2015-12-18 NOTE — Telephone Encounter (Signed)
Please see below; this is still open on my desktop; thank you

## 2015-12-31 NOTE — Telephone Encounter (Signed)
See routing comment

## 2016-01-01 NOTE — Telephone Encounter (Signed)
Thank you She has an appt scheduled for August 2nd I'll see if she comes for that

## 2016-01-04 ENCOUNTER — Other Ambulatory Visit: Payer: Self-pay

## 2016-01-05 MED ORDER — FLUTICASONE PROPIONATE 50 MCG/ACT NA SUSP: 2.0000 | Freq: Every day | NASAL | 5 refills | Status: DC

## 2016-01-06 ENCOUNTER — Encounter: Payer: Self-pay | Admitting: Family Medicine

## 2016-01-06 ENCOUNTER — Ambulatory Visit (INDEPENDENT_AMBULATORY_CARE_PROVIDER_SITE_OTHER): Payer: Medicare Other | Admitting: Family Medicine

## 2016-01-06 DIAGNOSIS — G4733 Obstructive sleep apnea (adult) (pediatric): Secondary | ICD-10-CM

## 2016-01-06 DIAGNOSIS — R7301 Impaired fasting glucose: Secondary | ICD-10-CM

## 2016-01-06 DIAGNOSIS — I10 Essential (primary) hypertension: Secondary | ICD-10-CM

## 2016-01-06 DIAGNOSIS — M174 Other bilateral secondary osteoarthritis of knee: Secondary | ICD-10-CM | POA: Diagnosis not present

## 2016-01-06 DIAGNOSIS — I5022 Chronic systolic (congestive) heart failure: Secondary | ICD-10-CM | POA: Diagnosis not present

## 2016-01-06 DIAGNOSIS — D649 Anemia, unspecified: Secondary | ICD-10-CM | POA: Diagnosis not present

## 2016-01-06 DIAGNOSIS — Z6841 Body Mass Index (BMI) 40.0 and over, adult: Secondary | ICD-10-CM

## 2016-01-06 LAB — CBC WITH DIFFERENTIAL/PLATELET
BASOS ABS: 0 {cells}/uL (ref 0–200)
BASOS PCT: 0 %
EOS ABS: 108 {cells}/uL (ref 15–500)
Eosinophils Relative: 2 %
HCT: 39.6 % (ref 35.0–45.0)
Hemoglobin: 12.7 g/dL (ref 11.7–15.5)
LYMPHS ABS: 1674 {cells}/uL (ref 850–3900)
Lymphocytes Relative: 31 %
MCH: 24.9 pg — ABNORMAL LOW (ref 27.0–33.0)
MCHC: 32.1 g/dL (ref 32.0–36.0)
MCV: 77.6 fL — AB (ref 80.0–100.0)
MONO ABS: 486 {cells}/uL (ref 200–950)
MPV: 8.5 fL (ref 7.5–12.5)
Monocytes Relative: 9 %
NEUTROS ABS: 3132 {cells}/uL (ref 1500–7800)
Neutrophils Relative %: 58 %
PLATELETS: 380 10*3/uL (ref 140–400)
RBC: 5.1 MIL/uL (ref 3.80–5.10)
RDW: 19.1 % — AB (ref 11.0–15.0)
WBC: 5.4 10*3/uL (ref 3.8–10.8)

## 2016-01-06 LAB — BASIC METABOLIC PANEL WITH GFR
BUN: 13 mg/dL (ref 7–25)
CALCIUM: 9.6 mg/dL (ref 8.6–10.4)
CHLORIDE: 105 mmol/L (ref 98–110)
CO2: 26 mmol/L (ref 20–31)
CREATININE: 0.88 mg/dL (ref 0.50–0.99)
GFR, Est African American: 81 mL/min (ref >=60)
GFR, Est Non African American: 71 mL/min (ref >=60)
Glucose, Bld: 104 mg/dL — ABNORMAL HIGH (ref 65–99)
Potassium: 4.6 mmol/L (ref 3.5–5.3)
Sodium: 141 mmol/L (ref 135–146)

## 2016-01-06 LAB — HEMOGLOBIN A1C
HEMOGLOBIN A1C: 6 % — AB (ref <5.7)
Mean Plasma Glucose: 126 mg/dL

## 2016-01-06 LAB — FERRITIN: FERRITIN: 36 ng/mL (ref 20–288)

## 2016-01-06 NOTE — Patient Instructions (Signed)
Let's get labs today Return in 3 months for follow-up  Check out the information at familydoctor.org entitled "Nutrition for Weight Loss: What You Need to Know about Fad Diets" Try to lose between 1-2 pounds per week by taking in fewer calories and burning off more calories You can succeed by limiting portions, limiting foods dense in calories and fat, becoming more active, and drinking 8 glasses of water a day (64 ounces) Don't skip meals, especially breakfast, as skipping meals may alter your metabolism Do not use over-the-counter weight loss pills or gimmicks that claim rapid weight loss A healthy BMI (or body mass index) is between 18.5 and 24.9 You can calculate your ideal BMI at the NIH website [employer]  Try to follow the DASH guidelines (DASH stands for Dietary Approaches to Stop Hypertension) Try to limit the sodium in your diet.  Ideally, consume less than 1.5 grams (less than 1,500mg ) per day. Do not add salt when cooking or at the table.  Check the sodium amount on labels when shopping, and choose items lower in sodium when given a choice. Avoid or limit foods that already contain a lot of sodium. Eat a diet rich in fruits and vegetables and whole grains.

## 2016-01-06 NOTE — Assessment & Plan Note (Signed)
No SHOB; seeing heart doctor next week; avoid salt like the plague

## 2016-01-06 NOTE — Assessment & Plan Note (Signed)
Check ferritin and CBC; stool cards given

## 2016-01-06 NOTE — Assessment & Plan Note (Signed)
Going for another sleep study, soon; using machine, they'll change settings if needed; she is actively working on weight loss

## 2016-01-06 NOTE — Assessment & Plan Note (Addendum)
Check glucose and A1c; she is actively working on weight loss

## 2016-01-06 NOTE — Assessment & Plan Note (Signed)
Using seated wheelchair; she is actively working on weight loss; will get raised toilet seat and shower chair

## 2016-01-06 NOTE — Assessment & Plan Note (Signed)
Sees Dr. Juliann Pares next week; glad she is motivated to lose weight; try DASH guidelines

## 2016-01-06 NOTE — Progress Notes (Signed)
Pulse 93   Temp 98.1 F (36.7 C) (Oral)   Resp 16   Wt (!) 326 lb (147.9 kg)   SpO2 96%   BMI 55.96 kg/m    Subjective:  MD note: patient says BP was 140 something, she can't remember the bottom number   Patient ID: Angie Webb, female    DOB: [birth year], 65 y.o.   MRN: [id number]  HPI: Angie Webb is a 65 y.o. female  Chief Complaint  Patient presents with  . Follow-up  . Medication Refill  . Knee Pain    bilateral   She is here for follow-up; her knee gave out; started last year when she was in the wheelchair; right knee went out and [facility] and gave her [facility] and got her out of the wheelchair; then in May, the left knee went out; the pain is debilitating; she wears a brace now; she has been taking tylenol but no NSAIDs; they took her off of celebrex years ago from bad stomach problem  HTN; cardiologist has her sodium continue; on medicines; "I'm on so much medication"  Obesity; she does two protein shakes and eating veggies; she has her mind made up to lose weight  Wants to go over labs and her kidney function  MCV 80 and MCH was 25.4; she has a hx of low iron; no bleeding; feeling tired; hx of low iron; no iron supplements now  Borderline diabetic; glucose in April was 114; diabetes runs in the family, grandmother; does not drink any sugary drinks, no loaf bread, no sweets  Excellent chol panel; HDL 56, LDL 79  Needs a raised toilet seat, bench for the shower; needs PT for the knee; going to [facility] for her [facility], still doing injections  OSA; she just went yesterday and they are scheduling her for another sleep study; they are doing that in the hospital; just got a new machine  Needs refills on her meds Depression screen [facility] 2/9 01/06/2016 09/30/2015 03/03/2015  Decreased Interest 0 0 0  Down, Depressed, Hopeless 0 0 1  PHQ - 2 Score 0 0 1   Relevant past medical, surgical, family and social history reviewed Past Medical History:    Diagnosis Date  . Anemia   . Anxiety   . Bilateral knee pain   . CHF (congestive heart failure) ([place]) 1999   Pt indicated hx of CHF since 1999  . Chronic hepatitis C without hepatic coma ([place])   . Depression   . Depression with anxiety   . Glaucoma   . Hx of hepatitis C 02/18/2015   Overview:  Started on Harvoni 03/26/14. HepC GT 1a. Viral Load 390K. Liver elastrography: F0. SVR at 24 weeks (resolved infection) Followed [facility] Dr. Thurmond Butts. S/P Harvoni treatments 2015-2016. History of IV drug use, illicit drug use.   Marland Kitchen Hypertension    Pt indicated hx of hypertension  . Impaired fasting glucose 10/15/2015  . Morbid obesity with BMI of 50.0-59.9, adult ([place])   . Primary osteoarthritis involving multiple joints    Past Surgical History:  Procedure Laterality Date  . ABDOMINAL HYSTERECTOMY  04/1999   total  . BREAST BIOPSY  6/11 & 6/13   Family History  Problem Relation Age of Onset  . Cancer Father    Social History  Substance Use Topics  . Smoking status: Former Research scientist (life sciences)  . Smokeless tobacco: Never Used     Comment: quit 2007 after 38yr.  . Alcohol use No  Interim medical history since last visit reviewed. Allergies and medications reviewed  Review of Systems Per HPI unless specifically indicated above     Objective:    Pulse 93   Temp 98.1 F (36.7 C) (Oral)   Resp 16   Wt (!) 326 lb (147.9 kg)   SpO2 96%   BMI 55.96 kg/m   Wt Readings from Last 3 Encounters:  01/06/16 (!) 326 lb (147.9 kg)  09/30/15 (!) 331 lb 9.6 oz (150.4 kg)  03/03/15 (!) 316 lb 14.4 oz (143.7 kg)    Physical Exam  Constitutional: She appears well-developed and well-nourished. No distress.  Morbidly obese  HENT:  Head: Normocephalic and atraumatic.  Eyes: EOM are normal. No scleral icterus.  Neck: No thyromegaly present.  Cardiovascular: Normal rate, regular rhythm and normal heart sounds.   No murmur heard. Pulmonary/Chest: Effort normal and breath sounds normal. No  respiratory distress. She has no wheezes.  Abdominal: Soft. Bowel sounds are normal. She exhibits no distension.  Musculoskeletal: Normal range of motion. She exhibits no edema.  Neurological: She is alert. She exhibits normal muscle tone. Gait (wide stance consistent with morbid obesity; uses walker) abnormal.  Skin: Skin is warm and dry. She is not diaphoretic. No pallor.  Psychiatric: She has a normal mood and affect. Her behavior is normal. Judgment and thought content normal. Her mood appears not anxious. She does not exhibit a depressed mood.  Very pleasant and cooperative, positive and upbeat   Results for orders placed or performed in visit on 01/06/16  CBC with Differential/Platelet  Result Value Ref Range   WBC 5.4 3.8 - 10.8 K/uL   RBC 5.10 3.80 - 5.10 MIL/uL   Hemoglobin 12.7 11.7 - 15.5 g/dL   HCT 39.6 35.0 - 45.0 %   MCV 77.6 (L) 80.0 - 100.0 fL   MCH 24.9 (L) 27.0 - 33.0 pg   MCHC 32.1 32.0 - 36.0 g/dL   RDW 19.1 (H) 11.0 - 15.0 %   Platelets 380 140 - 400 K/uL   MPV 8.5 7.5 - 12.5 fL   Neutro Abs 3,132 1,500 - 7,800 cells/uL   Lymphs Abs 1,674 850 - 3,900 cells/uL   Monocytes Absolute 486 200 - 950 cells/uL   Eosinophils Absolute 108 15 - 500 cells/uL   Basophils Absolute 0 0 - 200 cells/uL   Neutrophils Relative % 58 %   Lymphocytes Relative 31 %   Monocytes Relative 9 %   Eosinophils Relative 2 %   Basophils Relative 0 %   Smear Review Criteria for review not met   Hemoglobin A1c  Result Value Ref Range   Hgb A1c MFr Bld 6.0 (H) <5.7 %   Mean Plasma Glucose 126 mg/dL  Ferritin  Result Value Ref Range   Ferritin 36 20 - 288 ng/mL  BASIC METABOLIC PANEL WITH GFR  Result Value Ref Range   Sodium 141 135 - 146 mmol/L   Potassium 4.6 3.5 - 5.3 mmol/L   Chloride 105 98 - 110 mmol/L   CO2 26 20 - 31 mmol/L   Glucose, Bld 104 (H) 65 - 99 mg/dL   BUN 13 7 - 25 mg/dL   Creat 0.88 0.50 - 0.99 mg/dL   Calcium 9.6 8.6 - 10.4 mg/dL   GFR, Est African American 81  >=60 mL/min   GFR, Est Non African American 71 >=60 mL/min      Assessment & Plan:   Problem List Items Addressed This Visit  Cardiovascular and Mediastinum   Hypertension goal BP (blood pressure) < 140/90    Sees Dr. Clayborn Bigness next week; glad she is motivated to lose weight; try DASH guidelines      Relevant Medications   aspirin EC 81 MG tablet   Chronic systolic congestive heart failure, NYHA class 2 ([place])    No SHOB; seeing heart doctor next week; avoid salt like the plague      Relevant Medications   aspirin EC 81 MG tablet     Respiratory   Obstructive sleep apnea of adult    Going for another sleep study, soon; using machine, they'll change settings if needed; she is actively working on weight loss        Endocrine   Impaired fasting glucose    Check glucose and A1c; she is actively working on weight loss      Relevant Orders   Hemoglobin A1c (Completed)   BASIC METABOLIC PANEL WITH GFR (Completed)     Musculoskeletal and Integument   Other bilateral secondary osteoarthritis of knee    Using seated wheelchair; she is actively working on weight loss; will get raised toilet seat and shower chair        Other   Morbid obesity with BMI of 50.0-59.9, adult ([place])    Encouraged weight loss; see AVS      Absolute anemia    Check ferritin and CBC; stool cards given      Relevant Orders   CBC with Differential/Platelet (Completed)   Ferritin (Completed)    Other Visit Diagnoses   None.      Follow up plan: Return in about 3 months (around 04/07/2016) for follow-up.  An after-visit summary was printed and given to the patient at [facility].  Please see the patient instructions which may contain other information and recommendations beyond what is mentioned above in the assessment and plan.  Meds ordered this encounter  Medications  . aspirin EC 81 MG tablet    Sig: Take 81 mg by mouth.    Orders Placed This Encounter  Procedures  . CBC with  Differential/Platelet  . Hemoglobin A1c  . Ferritin  . BASIC METABOLIC PANEL WITH GFR

## 2016-01-09 NOTE — Assessment & Plan Note (Signed)
Encouraged weight loss; see AVS 

## 2016-01-11 ENCOUNTER — Telehealth: Payer: Self-pay | Admitting: Family Medicine

## 2016-01-13 NOTE — Telephone Encounter (Signed)
Pt.notified

## 2016-01-13 NOTE — Telephone Encounter (Signed)
Please let pt know that her A1c is stable, in the "prediabetes" range; really work on weight loss; we wish her luck; we know that's easier said than done; her red blood cells are a little small and pale; please take iron pill twice a week and get the Cologuard done if not already done

## 2016-01-14 ENCOUNTER — Telehealth: Payer: Self-pay

## 2016-01-14 NOTE — Telephone Encounter (Signed)
Patient needed her last [facility] sent [facility] [place] due to [employer]. So her toilet accessorities can be approved by her insurance due to necessity.

## 2016-01-25 ENCOUNTER — Other Ambulatory Visit: Payer: Self-pay

## 2016-01-25 MED ORDER — NYSTATIN 100000 UNIT/GM EX CREA: TOPICAL_CREAM | Freq: Two times a day (BID) | CUTANEOUS | 1 refills | Status: AC | PRN

## 2016-01-26 ENCOUNTER — Telehealth: Payer: Self-pay | Admitting: Family Medicine

## 2016-01-26 MED ORDER — LABETALOL HCL 100 MG PO TABS: 100.0000 mg | ORAL_TABLET | Freq: Two times a day (BID) | ORAL | 2 refills | Status: DC

## 2016-01-26 MED ORDER — CLONIDINE HCL 0.2 MG PO TABS: 0.2000 mg | ORAL_TABLET | Freq: Three times a day (TID) | ORAL | 2 refills | Status: DC

## 2016-01-26 NOTE — Telephone Encounter (Signed)
Last pulse was 93; Rx approved

## 2016-02-01 ENCOUNTER — Other Ambulatory Visit: Payer: Self-pay

## 2016-02-01 MED ORDER — LISINOPRIL 20 MG PO TABS: 20.0000 mg | ORAL_TABLET | Freq: Every day | ORAL | 1 refills | Status: DC

## 2016-02-01 NOTE — Telephone Encounter (Signed)
Last K+ and Cr reviewed; Rx approved 

## 2016-04-08 ENCOUNTER — Ambulatory Visit: Payer: Medicare Other | Admitting: Family Medicine

## 2016-04-25 ENCOUNTER — Telehealth: Payer: Self-pay | Admitting: Family Medicine

## 2016-04-25 ENCOUNTER — Other Ambulatory Visit: Payer: Self-pay

## 2016-04-25 NOTE — Telephone Encounter (Signed)
Pt needs refills on Clonidine and Labetalol [facility].

## 2016-04-26 ENCOUNTER — Telehealth: Payer: Self-pay

## 2016-04-26 ENCOUNTER — Other Ambulatory Visit: Payer: Self-pay

## 2016-04-26 MED ORDER — CLONIDINE HCL 0.2 MG PO TABS: 0.2000 mg | ORAL_TABLET | Freq: Three times a day (TID) | ORAL | 0 refills | Status: DC

## 2016-04-26 MED ORDER — LABETALOL HCL 100 MG PO TABS
100.0000 mg | ORAL_TABLET | Freq: Two times a day (BID) | ORAL | 0 refills | Status: AC
Start: 2016-04-26 — End: ?

## 2016-04-26 NOTE — Telephone Encounter (Signed)
Called Dr. Jinny Sandersallowood office and ask to speak with his nurse. She a patient therefore I left her a message labetalol and clonidine. Dr. lada is asking him to Refill those medication. Dr. lada does not want to prescribe those medication due to her heart condition and would like for him to prescribe them for now on.

## 2016-04-26 NOTE — Addendum Note (Signed)
Addended by: Apolinar Bero, Janit BernMELINDA P on: 04/26/2016 05:02 PM   Modules accepted: Orders

## 2016-04-26 NOTE — Telephone Encounter (Signed)
Please contact Dr. Glennis Brinkallwood's office and ask if he would respectfully take over the prescribing of her clonidine and labetalol; I don't usually prescribe both together because of risk of bradycardia and would be more comfortable if heart doctor was writing these; thank you

## 2016-04-26 NOTE — Telephone Encounter (Signed)
Please see my note I asked if you would please contact the cardiologist, not the patient Thank you

## 2016-04-26 NOTE — Telephone Encounter (Signed)
Called pt to go over her medication refill. The number that is listed in the chart is no longer in service. Not able to reach the pt. Pt does not have a secondary number on file.

## 2016-04-26 NOTE — Telephone Encounter (Signed)
I don't want her to run out with Dalia holiday weekend in front of us; one more Rx sent, but we'll see if cardiologist will please continue these from here on out

## 2016-04-26 NOTE — Telephone Encounter (Signed)
I apologize I didn't write I called her cardiologist  as well. I was able to leave a message with Dr. Glennis Brinkallwood's nurse.

## 2016-05-02 ENCOUNTER — Telehealth: Payer: Self-pay

## 2016-05-02 NOTE — Telephone Encounter (Signed)
Dr. Juliann Paresallwood nurse call regarding the pt Clonidine and labetalol. Dr. Juliann Paresallwood would like  To know how the  pt heart has been. Mention her heart rate from the last couple of times pt was seen here. [facility] stated they will continue to monitor the medication.

## 2016-06-09 ENCOUNTER — Other Ambulatory Visit: Payer: Self-pay

## 2016-06-09 MED ORDER — AMLODIPINE BESYLATE 10 MG PO TABS: 10.0000 mg | ORAL_TABLET | Freq: Every day | ORAL | 1 refills | Status: DC

## 2016-06-09 NOTE — Telephone Encounter (Signed)
Needs 90 day supply 

## 2016-06-27 ENCOUNTER — Telehealth: Payer: Self-pay | Admitting: Family Medicine

## 2016-06-27 NOTE — Telephone Encounter (Signed)
Please ask patient to schedule a follow-up appt; her last visit was almost 6 months ago; we'll get fasting labs at her appt; we'll address her incontinence supplies then too for documentation for her insurance Thank you

## 2016-06-27 NOTE — Telephone Encounter (Signed)
Pt had requested incontience supplies through [facility] [employer]. They will be sending the information to you. Patient would like to know if she need to schedule an appointment to discuss this?

## 2016-06-28 NOTE — Telephone Encounter (Signed)
lvm for patient to schedule appointment

## 2016-07-14 ENCOUNTER — Ambulatory Visit: Payer: Medicare Other | Admitting: Family Medicine

## 2016-08-10 ENCOUNTER — Ambulatory Visit: Payer: Medicare Other | Admitting: Family Medicine

## 2016-08-12 ENCOUNTER — Telehealth: Payer: Self-pay | Admitting: Family Medicine

## 2016-08-12 NOTE — Telephone Encounter (Signed)
Tried to call patient back about incontinence supplies, I stated she needed an appt because we have never seen her for this and we are not the ones who originally gave to her and would need proper documentation in chart for ins purposes.  Patient got upset and hung up on me.

## 2016-08-12 NOTE — Telephone Encounter (Signed)
Missed appointment due to knee going out she has started the injections. Asking that you please give her a call to discuss her incontinence supplies [phone]

## 2016-09-08 ENCOUNTER — Other Ambulatory Visit: Payer: Self-pay

## 2016-09-13 ENCOUNTER — Ambulatory Visit (INDEPENDENT_AMBULATORY_CARE_PROVIDER_SITE_OTHER): Payer: Medicare Other | Admitting: General Surgery

## 2016-09-13 ENCOUNTER — Encounter: Payer: Self-pay | Admitting: General Surgery

## 2016-09-13 ENCOUNTER — Ambulatory Visit (INDEPENDENT_AMBULATORY_CARE_PROVIDER_SITE_OTHER): Payer: Medicare Other | Admitting: Family Medicine

## 2016-09-13 ENCOUNTER — Encounter: Payer: Self-pay | Admitting: Family Medicine

## 2016-09-13 VITALS — BP 140/80 | HR 88 | Temp 97.9°F | Resp 16 | Wt 333.4 lb

## 2016-09-13 VITALS — BP 132/74 | HR 70 | Resp 16 | Ht 63.0 in | Wt 331.0 lb

## 2016-09-13 DIAGNOSIS — R32 Unspecified urinary incontinence: Secondary | ICD-10-CM | POA: Diagnosis not present

## 2016-09-13 DIAGNOSIS — L089 Local infection of the skin and subcutaneous tissue, unspecified: Secondary | ICD-10-CM | POA: Diagnosis not present

## 2016-09-13 DIAGNOSIS — L723 Sebaceous cyst: Secondary | ICD-10-CM | POA: Diagnosis not present

## 2016-09-13 NOTE — Patient Instructions (Signed)
We'll have you see Dr. Evette Cristal asap Use warm compresses in the meantime Call if you develop fevers or other problems

## 2016-09-13 NOTE — Assessment & Plan Note (Signed)
Associated with diuretic usage; incontinence pads already prescribed by cardiologist

## 2016-09-13 NOTE — Progress Notes (Signed)
BP 140/80   Pulse 88   Temp 97.9 F (36.6 C) (Oral)   Resp 16   Wt (!) 333 lb 7 oz (151.2 kg)   SpO2 95%   BMI 57.23 kg/m    Subjective:    Patient ID: Angie Webb, female    DOB: Aug 07, 1950, 66 y.o.   MRN: [id number]  HPI: Angie Webb is a 66 y.o. female  Chief Complaint  Patient presents with  . Cyst    right side of the back of her ear. Itching and notice it last wenesday   She has had this spot no bigger than a peanut for a while; then swelled up the size of a grape; using warm compresses No fevers, no drainage She has gained 9 pounds; she is not able to be as active; had knee injections; going to do more activity Missed appt with Dr. Juliann Pares because of her knee He changed her medicine, stopped the amlodipine, decreased the clonidine and increased lisinopril; now on HCTZ Not always able to get to the potty with the fluid pills; having urinary accidents No unusual odor to the urine; no fevers; no burning She needs incontinence pads, and Dr. Juliann Pares ordered them   Depression screen [facility] 2/9 09/13/2016 01/06/2016 09/30/2015 03/03/2015  Decreased Interest 0 0 0 0  Down, Depressed, Hopeless 0 0 0 1  PHQ - 2 Score 0 0 0 1    Relevant past medical, surgical, family and social history reviewed Past Medical History:  Diagnosis Date  . Anemia   . Anxiety   . Bilateral knee pain   . CHF (congestive heart failure) (HCC) 1999   Pt indicated hx of CHF since 1999  . Chronic hepatitis C without hepatic coma (HCC)   . Depression   . Depression with anxiety   . Glaucoma   . Hx of hepatitis C 02/18/2015   Overview:  Started on Harvoni 03/26/14. HepC GT 1a. Viral Load 390K. Liver elastrography: F0. SVR at 24 weeks (resolved infection) Followed by [facility] Dr. Alycia Rossetti. S/P Harvoni treatments 2015-2016. History of IV drug use, illicit drug use.   Marland Kitchen Hypertension    Pt indicated hx of hypertension  . Impaired fasting glucose 10/15/2015  . Morbid obesity with BMI of 50.0-59.9,  adult (HCC)   . Primary osteoarthritis involving multiple joints    Past Surgical History:  Procedure Laterality Date  . ABDOMINAL HYSTERECTOMY  04/1999   total  . BREAST BIOPSY  6/11 & 6/13   Social History  Substance Use Topics  . Smoking status: Former Games developer  . Smokeless tobacco: Never Used     Comment: quit 2007 after 89yrs.  . Alcohol use No   Interim medical history since last visit reviewed. Allergies and medications reviewed  Review of Systems Per HPI unless specifically indicated above     Objective:    BP 140/80   Pulse 88   Temp 97.9 F (36.6 C) (Oral)   Resp 16   Wt (!) 333 lb 7 oz (151.2 kg)   SpO2 95%   BMI 57.23 kg/m   Wt Readings from Last 3 Encounters:  09/13/16 (!) 331 lb (150.1 kg)  09/13/16 (!) 333 lb 7 oz (151.2 kg)  01/06/16 (!) 326 lb (147.9 kg)    Physical Exam  Constitutional: She appears well-developed and well-nourished. No distress.  Morbidly obese female, no distress; weight gain noted  HENT:  Head:    Firm nodular area consistent with sebaceous cyst, central pore visible;  no drainage; about the size ofa  grape  Lymphadenopathy:       Head (right side): No preauricular, no posterior auricular and no occipital adenopathy present.       Head (left side): No preauricular, no posterior auricular and no occipital adenopathy present.    She has no cervical adenopathy.       Right cervical: No superficial cervical, no deep cervical and no posterior cervical adenopathy present.      Left cervical: No superficial cervical, no deep cervical and no posterior cervical adenopathy present.      Assessment & Plan:   Problem List Items Addressed This Visit      Other   Urinary incontinence in female    Associated with diuretic usage; incontinence pads already prescribed by cardiologist       Other Visit Diagnoses    Infected sebaceous cyst of skin    -  Primary   refer to general surgeon   Relevant Orders   Ambulatory referral to  General Surgery       Follow up plan: No Follow-up on file.  An after-visit summary was printed and given to the patient at check-out.  Please see the patient instructions which may contain other information and recommendations beyond what is mentioned above in the assessment and plan.  Meds ordered this encounter  Medications  . hydrochlorothiazide (HYDRODIURIL) 25 MG tablet    Sig: Take 25 mg by mouth daily.  . cloNIDine (CATAPRES) 0.2 MG tablet    Sig: Take 0.2 mg by mouth 2 (two) times daily.  Marland Kitchen lisinopril (PRINIVIL,ZESTRIL) 20 MG tablet    Sig: Take 20 mg by mouth 2 (two) times daily.  . diclofenac sodium (VOLTAREN) 1 % GEL    Sig: Apply topically.    Orders Placed This Encounter  Procedures  . Ambulatory referral to General Surgery

## 2016-09-13 NOTE — Patient Instructions (Addendum)
The patient is aware to call back for any questions or concerns. Return for excision sebaceous cyst.

## 2016-09-13 NOTE — Progress Notes (Signed)
Patient ID: Angie Webb, female   DOB: 01-18-1951, 66 y.o.   MRN: [id number]  Chief Complaint  Patient presents with  . Mass    HPI Angie Webb is a 66 y.o. female.  Here today for evaluation of a knot on back of her right ear.Patient noticed the area over 20 years ago. It has grown in size. Last week it was red and swollen and very itchy. No pain or drainage.  HPI  Past Medical History:  Diagnosis Date  . Anemia   . Anxiety   . Bilateral knee pain   . CHF (congestive heart failure) (HCC) 1999   Pt indicated hx of CHF since 1999  . Chronic hepatitis C without hepatic coma (HCC)   . Depression   . Depression with anxiety   . Glaucoma   . Hx of hepatitis C 02/18/2015   Overview:  Started on Harvoni 03/26/14. HepC GT 1a. Viral Load 390K. Liver elastrography: F0. SVR at 24 weeks (resolved infection) Followed by [facility] Dr. Alycia Rossetti. S/P Harvoni treatments 2015-2016. History of IV drug use, illicit drug use.   Marland Kitchen Hypertension    Pt indicated hx of hypertension  . Impaired fasting glucose 10/15/2015  . Morbid obesity with BMI of 50.0-59.9, adult (HCC)   . Primary osteoarthritis involving multiple joints     Past Surgical History:  Procedure Laterality Date  . ABDOMINAL HYSTERECTOMY  04/1999   total  . BREAST BIOPSY  6/11 & 6/13    Family History  Problem Relation Age of Onset  . Cancer Father   . Hypertension Mother   . Constipation Mother   . Liver disease Brother     Social History Social History  Substance Use Topics  . Smoking status: Former Games developer  . Smokeless tobacco: Never Used     Comment: quit 2007 after 89yrs.  . Alcohol use No    Allergies  Allergen Reactions  . Other Other (See Comments) and Shortness Of Breath    beesting - eye swelling  . Bee Venom     Bees    Current Outpatient Prescriptions  Medication Sig Dispense Refill  . aspirin EC 81 MG tablet Take 81 mg by mouth.    . cloNIDine (CATAPRES) 0.2 MG tablet Take 0.2 mg by mouth 2  (two) times daily.    . diclofenac sodium (VOLTAREN) 1 % GEL Apply topically.    . fluticasone (FLONASE) 50 MCG/ACT nasal spray Place 2 sprays into both nostrils daily. 16 g 5  . hydrochlorothiazide (HYDRODIURIL) 25 MG tablet Take 25 mg by mouth daily.    Marland Kitchen labetalol (NORMODYNE) 100 MG tablet Take 1 tablet (100 mg total) by mouth 2 (two) times daily. 60 tablet 0  . latanoprost (XALATAN) 0.005 % ophthalmic solution Apply to eye.    Marland Kitchen lisinopril (PRINIVIL,ZESTRIL) 20 MG tablet Take 20 mg by mouth 2 (two) times daily.    . Misc. Devices [facility]) MISC 1 Device by Does not apply route daily. 1 each 0  . nystatin cream (MYCOSTATIN) Apply topically 2 (two) times daily as needed for dry skin. To affected area(s) 30 g 1  . omeprazole (PRILOSEC) 20 MG capsule TAKE ONE CAPSULE BY MOUTH TWICE A DAY. 180 capsule 2  . timolol (TIMOPTIC) 0.5 % ophthalmic solution     . torsemide (DEMADEX) 20 MG tablet 20 mg daily.      No current facility-administered medications for this visit.     Review of Systems Review of Systems  Constitutional: Negative.  Respiratory: Negative.   Cardiovascular: Negative.     Blood pressure 132/74, pulse 70, resp. rate 16, height  (1.6 m), weight (!) 331 lb (150.1 kg).  Physical Exam Physical Exam  Constitutional: She is oriented to person, place, and time. She appears well-developed and well-nourished.  HENT:  Head:    Neurological: She is alert and oriented to person, place, and time.  Skin: Skin is warm and dry.  Psychiatric: Her behavior is normal.    Data Reviewed  Notes reviewed  Assessment      Sebaceous cyst behind right ear. Symptomatic. No physical signs of infection.   Plan   Discussed excision with local anesthesia in office setting. Patient is agreeable.     Return for excision sebaceous cyst.  HPI, Physical Exam, Assessment and Plan have been scribed under the direction and in the presence of Kathreen Cosier, MD  I have completed the  exam and reviewed the above documentation for accuracy and completeness.  I agree with the above.  Museum/gallery conservator has been used and any errors in dictation or transcription are unintentional.  Seeplaputhur G. Evette Cristal, M.D., F.A.C.S.  Ples Specter, CMA    SANKAR,SEEPLAPUTHUR G 09/13/2016, 1:10 PM

## 2016-09-14 ENCOUNTER — Telehealth: Payer: Self-pay | Admitting: Family Medicine

## 2016-09-14 ENCOUNTER — Other Ambulatory Visit: Payer: Self-pay

## 2016-09-14 MED ORDER — OMEPRAZOLE 20 MG PO CPDR: 20.0000 mg | DELAYED_RELEASE_CAPSULE | Freq: Every day | ORAL | 0 refills | Status: AC

## 2016-09-14 NOTE — Telephone Encounter (Signed)
Patient notified

## 2016-09-14 NOTE — Telephone Encounter (Signed)
Pt is requesting refills on all of her medications.

## 2016-09-14 NOTE — Telephone Encounter (Signed)
Please let patient know that we really need to wean her PPI down (omeprazole) Decrease it to once a day This medicine has risks associated with it if she takes it Jarnagin-term Avoid triggers, do not eat for 3 hours before bed, etc.

## 2016-09-21 ENCOUNTER — Encounter: Payer: Self-pay | Admitting: General Surgery

## 2016-09-21 ENCOUNTER — Ambulatory Visit (INDEPENDENT_AMBULATORY_CARE_PROVIDER_SITE_OTHER): Payer: Medicare Other | Admitting: General Surgery

## 2016-09-21 VITALS — BP 140/72 | HR 88 | Resp 20 | Ht 63.0 in | Wt 331.0 lb

## 2016-09-21 DIAGNOSIS — L723 Sebaceous cyst: Secondary | ICD-10-CM

## 2016-09-21 DIAGNOSIS — L089 Local infection of the skin and subcutaneous tissue, unspecified: Secondary | ICD-10-CM | POA: Diagnosis not present

## 2016-09-21 MED ORDER — CEPHALEXIN 500 MG PO CAPS: 500.0000 mg | ORAL_CAPSULE | Freq: Four times a day (QID) | ORAL | 0 refills | Status: AC

## 2016-09-21 NOTE — Patient Instructions (Addendum)
Follow up in 5-6 weeks. Keep area clean.  Patient instructed she may use Ibuprofen for pain control. Also she will change the gauze daily after bathing.

## 2016-09-21 NOTE — Progress Notes (Signed)
Patient ID: Angie Webb, female   DOB: 1951/03/09, 66 y.o.   MRN: [id number]  Chief Complaint  Patient presents with  . Procedure    HPI Angie Webb is a 66 y.o. female.  Here today for planned excision scalp cyst. She is extremely anxious about the procedure. She is using a walker and she is short of breath.  HPI  Past Medical History:  Diagnosis Date  . Anemia   . Anxiety   . Bilateral knee pain   . CHF (congestive heart failure) (HCC) 1999   Pt indicated hx of CHF since 1999  . Chronic hepatitis C without hepatic coma (HCC)   . Depression   . Depression with anxiety   . Glaucoma   . Hx of hepatitis C 02/18/2015   Overview:  Started on Harvoni 03/26/14. HepC GT 1a. Viral Load 390K. Liver elastrography: F0. SVR at 24 weeks (resolved infection) Followed by [facility] Dr. Alycia Rossetti. S/P Harvoni treatments 2015-2016. History of IV drug use, illicit drug use.   Marland Kitchen Hypertension    Pt indicated hx of hypertension  . Impaired fasting glucose 10/15/2015  . Morbid obesity with BMI of 50.0-59.9, adult (HCC)   . Primary osteoarthritis involving multiple joints     Past Surgical History:  Procedure Laterality Date  . ABDOMINAL HYSTERECTOMY  04/1999   total  . BREAST BIOPSY  6/11 & 6/13    Family History  Problem Relation Age of Onset  . Cancer Father   . Hypertension Mother   . Constipation Mother   . Liver disease Brother     Social History Social History  Substance Use Topics  . Smoking status: Former Games developer  . Smokeless tobacco: Never Used     Comment: quit 2007 after 65yrs.  . Alcohol use No    Allergies  Allergen Reactions  . Other Other (See Comments) and Shortness Of Breath    beesting - eye swelling  . Bee Venom     Bees    Current Outpatient Prescriptions  Medication Sig Dispense Refill  . aspirin EC 81 MG tablet Take 81 mg by mouth.    . cloNIDine (CATAPRES) 0.2 MG tablet Take 0.2 mg by mouth 2 (two) times daily.    . diclofenac sodium (VOLTAREN) 1  % GEL Apply topically.    . fluticasone (FLONASE) 50 MCG/ACT nasal spray Place 2 sprays into both nostrils daily. 16 g 5  . hydrochlorothiazide (HYDRODIURIL) 25 MG tablet Take 25 mg by mouth daily.    Marland Kitchen labetalol (NORMODYNE) 100 MG tablet Take 1 tablet (100 mg total) by mouth 2 (two) times daily. 60 tablet 0  . latanoprost (XALATAN) 0.005 % ophthalmic solution Apply to eye.    Marland Kitchen lisinopril (PRINIVIL,ZESTRIL) 20 MG tablet Take 20 mg by mouth 2 (two) times daily.    . Misc. Devices [facility]) MISC 1 Device by Does not apply route daily. 1 each 0  . nystatin cream (MYCOSTATIN) Apply topically 2 (two) times daily as needed for dry skin. To affected area(s) 30 g 1  . omeprazole (PRILOSEC) 20 MG capsule Take 1 capsule (20 mg total) by mouth daily. 30 capsule 0  . timolol (TIMOPTIC) 0.5 % ophthalmic solution     . torsemide (DEMADEX) 20 MG tablet 20 mg daily.     . cephALEXin (KEFLEX) 500 MG capsule Take 1 capsule (500 mg total) by mouth 4 (four) times daily. 20 capsule 0   No current facility-administered medications for this visit.  Review of Systems Review of Systems  Constitutional: Negative.   Respiratory: Positive for shortness of breath.   Cardiovascular: Negative.     Blood pressure 140/72, pulse 88, resp. rate 20, height  (1.6 m), weight (!) 331 lb (150.1 kg).  Physical Exam Physical Exam  Constitutional: She is oriented to person, place, and time. She appears well-developed and well-nourished.  Neurological: She is alert and oriented to person, place, and time.  Skin: Skin is warm and dry.  Psychiatric: Her behavior is normal.  Since last evaluation cyst behind right ear is larger and red, more tender.  Data Reviewed Prior note  Assessment    Infected cyst behind right ear.    Plan    Pt advised on finding. Drainage as opposed to excision recommended. Still very anxious but she agreed to proceed.  Procedure Note: Incision/drainage of Infected Cyst:  Area  was prepped with alcohol wipe. Used 2 ml of 1% lidocaine mixed with 0.5 % marcaine locally. 5mm cruciate incision was made and a 2-3 ml of thick yellow pus was drained. Area was covered with gauze and tape.       Follow up in 5-6 weeks. Kelfex 500 mg po for 5 days Keep area clean.  HPI, Physical Exam, Assessment and Plan have been scribed under the direction and in the presence of Kathreen Cosier, MD  Dorathy Daft, RN  I have completed the exam and reviewed the above documentation for accuracy and completeness.  I agree with the above.  Museum/gallery conservator has been used and any errors in dictation or transcription are unintentional.  Selda Jalbert G. Evette Cristal, M.D., F.A.C.S.   Gerlene Burdock G 09/22/2016, 10:35 AM

## 2016-10-27 ENCOUNTER — Ambulatory Visit (INDEPENDENT_AMBULATORY_CARE_PROVIDER_SITE_OTHER): Payer: Medicare Other | Admitting: General Surgery

## 2016-10-27 ENCOUNTER — Encounter: Payer: Self-pay | Admitting: General Surgery

## 2016-10-27 VITALS — BP 142/74 | HR 84 | Resp 20 | Ht 63.0 in | Wt 331.0 lb

## 2016-10-27 DIAGNOSIS — L723 Sebaceous cyst: Secondary | ICD-10-CM

## 2016-10-27 NOTE — Progress Notes (Signed)
Patient ID: Angie Webb, female   DOB: 1950-09-12, 66 y.o.   MRN: [id number]  Chief Complaint  Patient presents with  . Follow-up    scalp cyst    HPI Angie Webb is a 66 y.o. female here today for her follow up infected cyst on scalp. Patient states the area is well healed.  HPI  Past Medical History:  Diagnosis Date  . Anemia   . Anxiety   . Bilateral knee pain   . CHF (congestive heart failure) (HCC) 1999   Pt indicated hx of CHF since 1999  . Chronic hepatitis C without hepatic coma (HCC)   . Depression   . Depression with anxiety   . Glaucoma   . Hx of hepatitis C 02/18/2015   Overview:  Started on Harvoni 03/26/14. HepC GT 1a. Viral Load 390K. Liver elastrography: F0. SVR at 24 weeks (resolved infection) Followed by [facility] Dr. Alycia Rossetti. S/P Harvoni treatments 2015-2016. History of IV drug use, illicit drug use.   Marland Kitchen Hypertension    Pt indicated hx of hypertension  . Impaired fasting glucose 10/15/2015  . Morbid obesity with BMI of 50.0-59.9, adult (HCC)   . Primary osteoarthritis involving multiple joints     Past Surgical History:  Procedure Laterality Date  . ABDOMINAL HYSTERECTOMY  04/1999   total  . BREAST BIOPSY  6/11 & 6/13    Family History  Problem Relation Age of Onset  . Cancer Father   . Hypertension Mother   . Constipation Mother   . Liver disease Brother     Social History Social History  Substance Use Topics  . Smoking status: Former Games developer  . Smokeless tobacco: Never Used     Comment: quit 2007 after 31yrs.  . Alcohol use No    Allergies  Allergen Reactions  . Other Other (See Comments) and Shortness Of Breath    beesting - eye swelling  . Bee Venom     Bees    Current Outpatient Prescriptions  Medication Sig Dispense Refill  . aspirin EC 81 MG tablet Take 81 mg by mouth.    . cloNIDine (CATAPRES) 0.2 MG tablet Take 0.2 mg by mouth 2 (two) times daily.    . diclofenac sodium (VOLTAREN) 1 % GEL Apply topically.    .  fluticasone (FLONASE) 50 MCG/ACT nasal spray Place 2 sprays into both nostrils daily. 16 g 5  . hydrochlorothiazide (HYDRODIURIL) 25 MG tablet Take 25 mg by mouth daily.    Marland Kitchen labetalol (NORMODYNE) 100 MG tablet Take 1 tablet (100 mg total) by mouth 2 (two) times daily. 60 tablet 0  . latanoprost (XALATAN) 0.005 % ophthalmic solution Apply to eye.    Marland Kitchen lisinopril (PRINIVIL,ZESTRIL) 20 MG tablet Take 20 mg by mouth 2 (two) times daily.    . Misc. Devices [facility]) MISC 1 Device by Does not apply route daily. 1 each 0  . nystatin cream (MYCOSTATIN) Apply topically 2 (two) times daily as needed for dry skin. To affected area(s) 30 g 1  . omeprazole (PRILOSEC) 20 MG capsule Take 1 capsule (20 mg total) by mouth daily. 30 capsule 0  . timolol (TIMOPTIC) 0.5 % ophthalmic solution     . torsemide (DEMADEX) 20 MG tablet 20 mg daily.      No current facility-administered medications for this visit.     Review of Systems Review of Systems  Constitutional: Negative.   Respiratory: Negative.   Cardiovascular: Negative.     Blood pressure (!) 142/74, pulse  84, resp. rate 20, height 5\' 3"  (1.6 m), weight (!) 331 lb (150.1 kg).  Physical Exam Physical Exam  Constitutional: She is oriented to person, place, and time. She appears well-developed and well-nourished.  Neurological: She is alert and oriented to person, place, and time.  Skin: Skin is warm and dry.  1.5 cm sebaceous cyst behind right ear. No sign of infection. No enlarged nodes in neck Data Reviewed Prior notes reviewed   Assessment    Skin cyst -resolved infection    Plan   Recommended excision and she is agreeable. Patient to return for excision skin cyst behind right ear.   HPI, Physical Exam, Assessment and Plan have been scribed under the direction and in the presence of Kathreen CosierS. G. Akeela Busk, MD  Ples SpecterJessica Qualls, CMA     I have completed the exam and reviewed the above documentation for accuracy and completeness.  I agree with  the above.  Museum/gallery conservatorDragon Technology has been used and any errors in dictation or transcription are unintentional.  Ilamae Geng G. Evette CristalSankar, M.D., F.A.C.S.    Ples SpecterQualls, Jessica 10/27/2016, 9:52 AM

## 2016-10-27 NOTE — Patient Instructions (Signed)
Patient to return for excision skin cyst behind right ear.

## 2016-11-09 ENCOUNTER — Ambulatory Visit: Payer: Medicare Other | Admitting: General Surgery

## 2016-11-28 ENCOUNTER — Ambulatory Visit: Payer: Medicare Other | Admitting: General Surgery

## 2017-01-24 ENCOUNTER — Other Ambulatory Visit: Payer: Self-pay

## 2017-01-24 MED ORDER — FLUTICASONE PROPIONATE 50 MCG/ACT NA SUSP: 2.0000 | Freq: Every day | NASAL | 5 refills | Status: DC

## 2017-05-04 ENCOUNTER — Ambulatory Visit
Admission: RE | Admit: 2017-05-04 | Discharge: 2017-05-04 | Disposition: A | Discharge status: Home / Self Care | Payer: 59 | Source: Ambulatory Visit | Attending: Physician Assistant | Admitting: Physician Assistant

## 2017-05-04 ENCOUNTER — Other Ambulatory Visit: Payer: Self-pay | Admitting: Physician Assistant

## 2017-05-04 DIAGNOSIS — M79604 Pain in right leg: Secondary | ICD-10-CM | POA: Diagnosis not present

## 2017-06-07 ENCOUNTER — Encounter: Payer: Medicare Other | Admitting: Family Medicine

## 2018-01-30 ENCOUNTER — Other Ambulatory Visit: Payer: Self-pay

## 2018-01-30 MED ORDER — FLUTICASONE PROPIONATE 50 MCG/ACT NA SUSP: 2.0000 | Freq: Every day | NASAL | 5 refills | Status: AC

## 2019-02-03 IMAGING — US US EXTREM LOW VENOUS*R*
1 series · 13 of 24 positions shown · non-contrast
Comparison: None.

CLINICAL DATA: Right lower extremity pain and edema following
recent fall. Evaluate for DVT.



[Series 1: us extrem low venous*right* · 13 of 44 slices shown]
[im 1/44]
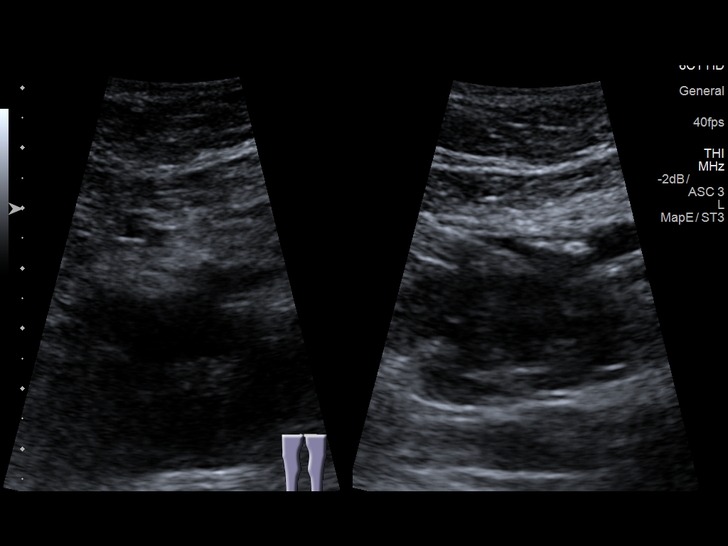
[im 4/44]
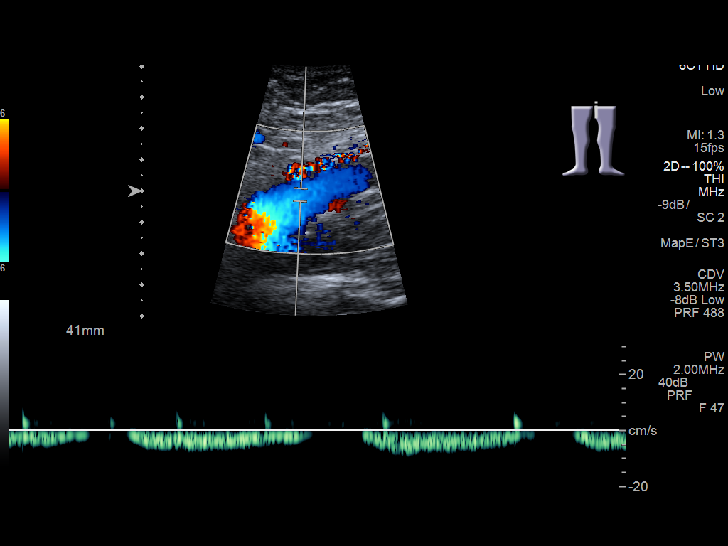
[im 8/44]
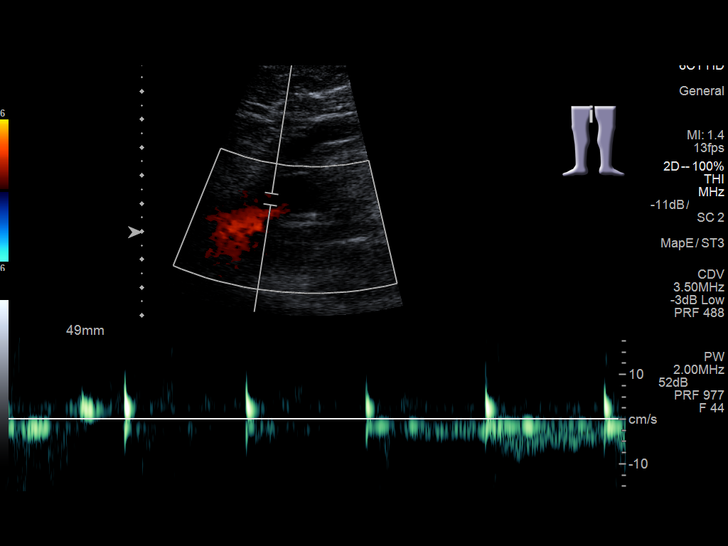
[im 12/44]
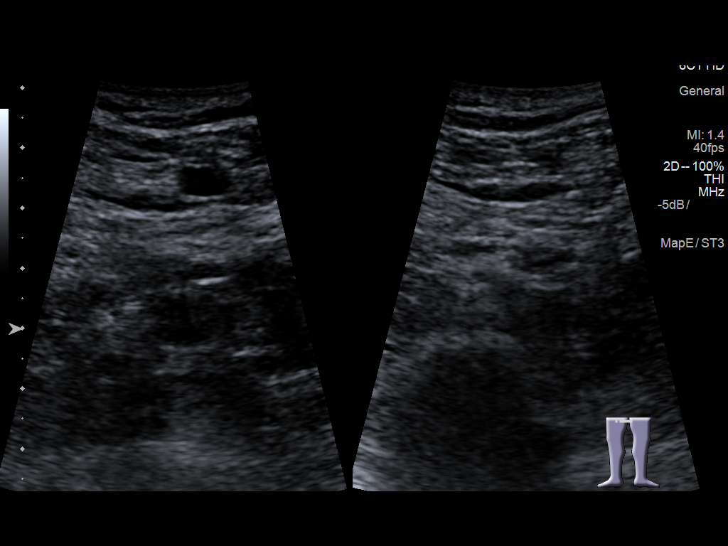
[im 15/44]
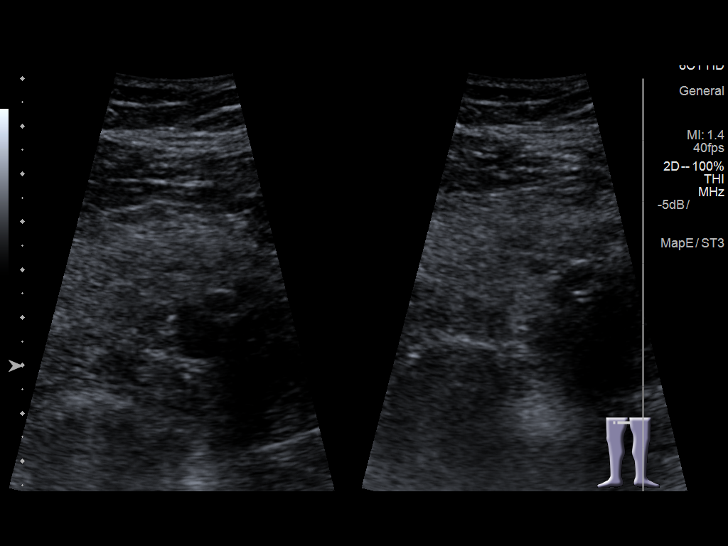
[im 19/44]
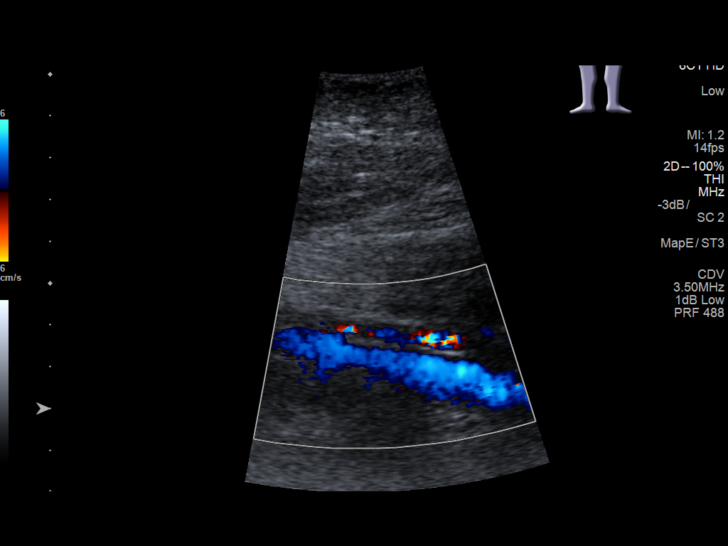
[im 23/44]
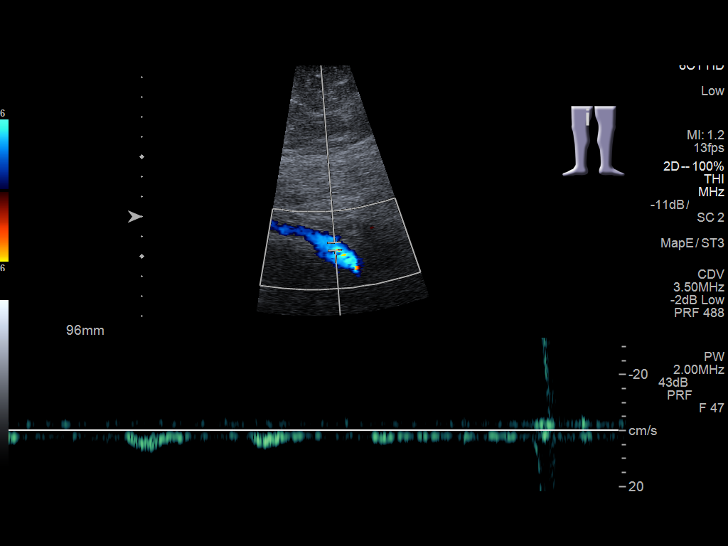
[im 25/44]
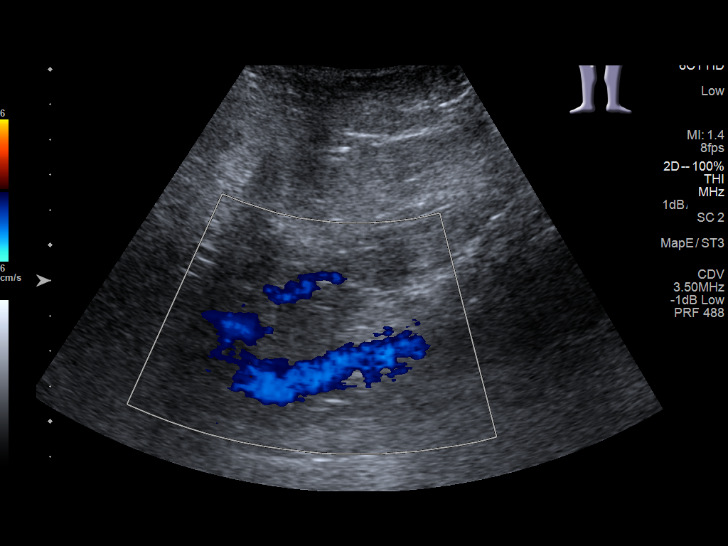
[im 29/44]
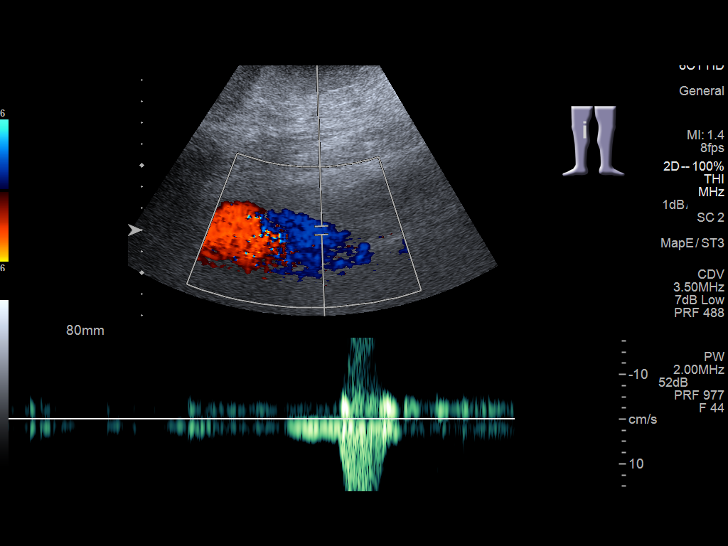
[im 32/44]
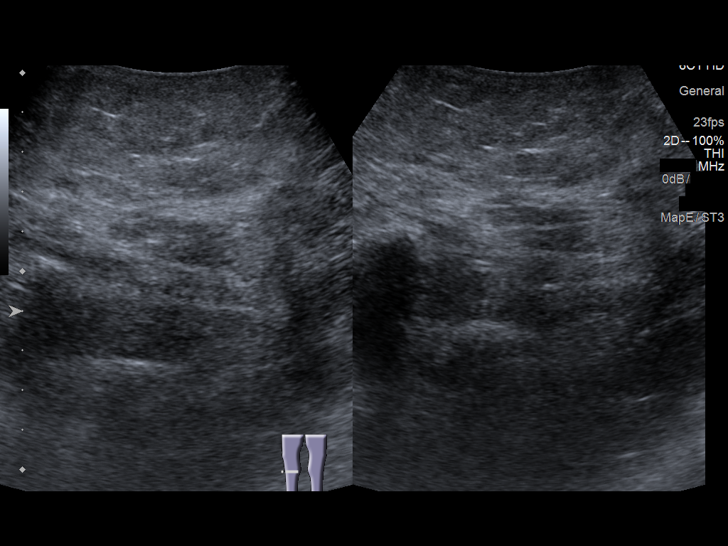
[im 36/44]
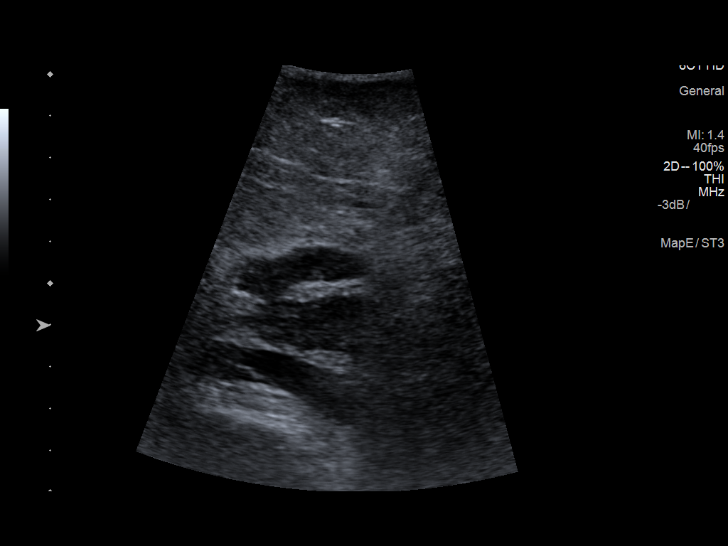
[im 40/44]
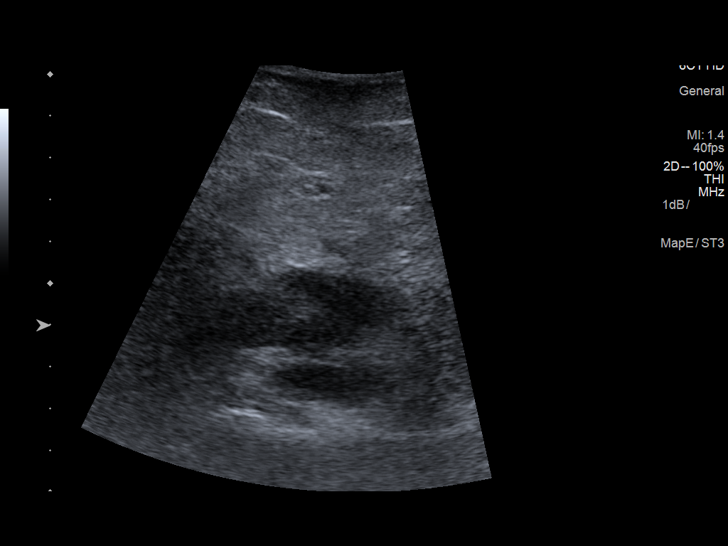
[im 44/44]
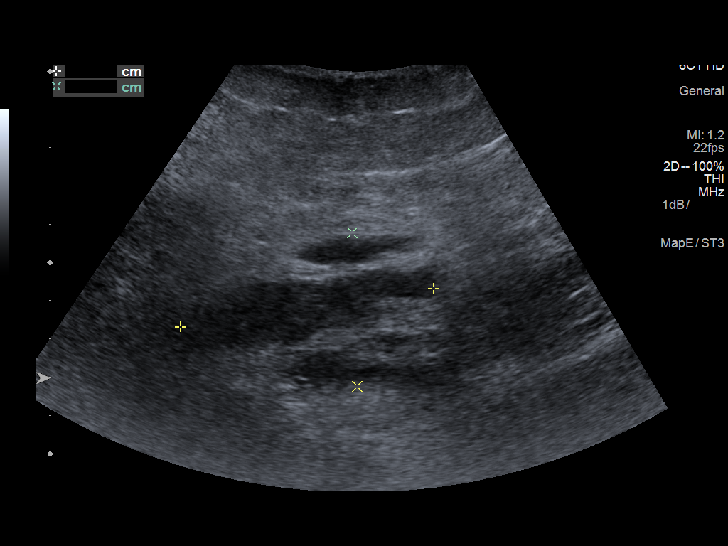

[13 of 24 positions shown; findings below may reference images not displayed]

FINDINGS: Examination is degraded due to patient body habitus and poor
sonographic window.

Contralateral Common Femoral Vein: Respiratory phasicity is normal
and symmetric with the symptomatic side. No evidence of thrombus.
Normal compressibility.

Common Femoral Vein: No evidence of thrombus. Normal
compressibility, respiratory phasicity and response to augmentation.

Saphenofemoral Junction: No evidence of thrombus. Normal
compressibility and flow on color Doppler imaging.

Profunda Femoral Vein: No evidence of thrombus. Normal
compressibility and flow on color Doppler imaging.

Femoral Vein: No evidence of thrombus. Normal compressibility,
respiratory phasicity and response to augmentation.

Popliteal Vein: No evidence of thrombus. Normal compressibility,
respiratory phasicity and response to augmentation.

Calf Veins: No evidence of thrombus. Normal compressibility and flow
on color Doppler imaging.

Superficial Great Saphenous Vein: No evidence of thrombus. Normal
compressibility.

Venous Reflux:  None.

Other Findings: Note is made of a approximately 6.7 x 4.0 x 3.6 cm
minimally complex fluid collection within the right popliteal fossa
favored to represent a Baker cyst.
IMPRESSION: 1. No evidence of DVT within the right lower extremity.
2. Incidentally noted approximately 6.7 cm minimally complex
right-sided Baker cyst.

## 2019-02-22 ENCOUNTER — Other Ambulatory Visit: Payer: Self-pay | Admitting: Family Medicine

## 2019-02-22 DIAGNOSIS — Z87891 Personal history of nicotine dependence: Secondary | ICD-10-CM

## 2019-02-22 DIAGNOSIS — Z136 Encounter for screening for cardiovascular disorders: Secondary | ICD-10-CM

## 2019-02-25 ENCOUNTER — Telehealth: Payer: Self-pay | Admitting: *Deleted

## 2019-02-25 DIAGNOSIS — Z87891 Personal history of nicotine dependence: Secondary | ICD-10-CM

## 2019-02-25 DIAGNOSIS — Z122 Encounter for screening for malignant neoplasm of respiratory organs: Secondary | ICD-10-CM

## 2019-02-25 NOTE — Telephone Encounter (Signed)
Received referral for initial lung cancer screening scan. Contacted patient and obtained smoking history,() as well as answering questions related to screening process. Patient denies signs of lung cancer such as weight loss or hemoptysis. Patient denies comorbidity that would prevent curative treatment if lung cancer were found. Patient is scheduled for shared decision making visit on 02/27/19 via doximetry and CT scan on 03/05/19 at [place].

## 2019-02-25 NOTE — Telephone Encounter (Signed)
Received referral for low dose lung cancer screening CT scan. Message left at phone number listed in EMR for patient to call me back to facilitate scheduling scan.  

## 2019-02-27 ENCOUNTER — Encounter: Payer: Self-pay | Admitting: *Deleted

## 2019-02-27 ENCOUNTER — Inpatient Hospital Stay: Payer: 59 | Attending: Oncology | Admitting: Oncology

## 2019-02-27 DIAGNOSIS — Z122 Encounter for screening for malignant neoplasm of respiratory organs: Secondary | ICD-10-CM

## 2019-02-27 DIAGNOSIS — Z87891 Personal history of nicotine dependence: Secondary | ICD-10-CM

## 2019-02-27 NOTE — Progress Notes (Signed)
Virtual Visit via Video Note  I connected with Mrs. Guin on 02/27/19 at  1:30 PM EDT by a video enabled telemedicine application and verified that I am speaking with the correct person using two identifiers.  Location: Patient: Home Provider:Office   I discussed the limitations of evaluation and management by telemedicine and the availability of in person appointments. The patient expressed understanding and agreed to proceed.  I discussed the assessment and treatment plan with the patient. The patient was provided an opportunity to ask questions and all were answered. The patient agreed with the plan and demonstrated an understanding of the instructions.   The patient was advised to call back or seek an in-person evaluation if the symptoms worsen or if the condition fails to improve as anticipated.   In accordance with CMS guidelines, patient has met eligibility criteria including age, absence of signs or symptoms of lung cancer.  Social History   Tobacco Use  . Smoking status: Former Smoker    Packs/day: 1.00    Years: 30.00    Pack years: 30.00    Types: Cigarettes    Quit date: 2007    Years since quitting: 13.7  . Smokeless tobacco: Never Used  Substance Use Topics  . Alcohol use: No    Alcohol/week: 0.0 standard drinks  . Drug use: No    Comment: successful prior treatmetn, patient has been clean for 61yr.      A shared decision-making session was conducted prior to the performance of CT scan. This includes one or more decision aids, includes benefits and harms of screening, follow-up diagnostic testing, over-diagnosis, false positive rate, and total radiation exposure.   Counseling on the importance of adherence to annual lung cancer LDCT screening, impact of co-morbidities, and ability or willingness to undergo diagnosis and treatment is imperative for compliance of the program.   Counseling on the importance of continued smoking cessation for former smokers; the  importance of smoking cessation for current smokers, and information about tobacco cessation interventions have been given to patient including [place] [facility] [place] programs.   Written order for lung cancer screening with LDCT has been given to the patient and any and all questions have been answered to the best of my abilities.    Yearly follow up will be coordinated by [facility] Navigator.  I provided 15 minutes of face-to-face video visit time during this encounter, and > 50% was spent counseling as documented under my assessment & plan.   JJacquelin Hawking NP

## 2019-03-05 ENCOUNTER — Other Ambulatory Visit: Payer: Self-pay

## 2019-03-05 ENCOUNTER — Ambulatory Visit
Admission: RE | Admit: 2019-03-05 | Discharge: 2019-03-05 | Disposition: A | Discharge status: Home / Self Care | Payer: 59 | Source: Ambulatory Visit | Attending: Oncology | Admitting: Oncology

## 2019-03-05 ENCOUNTER — Ambulatory Visit
Admission: RE | Admit: 2019-03-05 | Discharge: 2019-03-05 | Disposition: A | Discharge status: Home / Self Care | Payer: 59 | Source: Ambulatory Visit | Attending: Family Medicine | Admitting: Family Medicine

## 2019-03-05 DIAGNOSIS — Z87891 Personal history of nicotine dependence: Secondary | ICD-10-CM | POA: Diagnosis present

## 2019-03-05 DIAGNOSIS — Z136 Encounter for screening for cardiovascular disorders: Secondary | ICD-10-CM | POA: Insufficient documentation

## 2019-03-05 DIAGNOSIS — Z122 Encounter for screening for malignant neoplasm of respiratory organs: Secondary | ICD-10-CM

## 2019-03-06 ENCOUNTER — Encounter: Payer: Self-pay | Admitting: Family Medicine

## 2019-03-06 ENCOUNTER — Telehealth: Payer: Self-pay | Admitting: *Deleted

## 2019-03-06 DIAGNOSIS — I700 Atherosclerosis of aorta: Secondary | ICD-10-CM | POA: Insufficient documentation

## 2019-03-06 NOTE — Telephone Encounter (Signed)
Documentation reviewed. Aortic Atherosclerosis added to problem list, will discuss at next visit.

## 2019-03-06 NOTE — Telephone Encounter (Signed)
Notified patient of LDCT lung cancer screening program results with recommendation for 12 month follow up imaging. Also notified of incidental findings noted below and is encouraged to discuss further with PCP who will receive a copy of this note and/or the CT report. Patient verbalizes understanding.   IMPRESSION: Lung-RADS 1, negative. Continue annual screening with low-dose chest CT without contrast in 12 months.  Aortic Atherosclerosis (ICD10-I70.0). 

## 2020-02-28 ENCOUNTER — Telehealth: Payer: Self-pay | Admitting: *Deleted

## 2020-02-28 NOTE — Telephone Encounter (Signed)
(  02/28/2020) Unable to leave message for pt to notify them that it is time to schedule annual low dose lung cancer screening CT scan. Will call back to verify information prior to the scan being scheduled °SRW °  ° ° ° ° ° ° ° °

## 2020-03-11 ENCOUNTER — Telehealth: Payer: Self-pay | Admitting: *Deleted

## 2020-03-11 NOTE — Telephone Encounter (Signed)
Attempted to contact and schedule lung screening scan. Message left for patient to call back to schedule. 

## 2020-04-16 ENCOUNTER — Telehealth: Payer: Self-pay

## 2020-04-16 NOTE — Telephone Encounter (Signed)
Attempted to contact patient to schedule lung screening scan but unable to reach her. Unable to reach her on moblie phone, left message on home phone number for pt to return call back to schedule CT scan. Return call phone number was provided in message.

## 2020-06-10 ENCOUNTER — Encounter: Payer: Self-pay | Admitting: *Deleted

## 2021-01-28 ENCOUNTER — Encounter: Payer: Self-pay | Admitting: Emergency Medicine

## 2021-01-28 ENCOUNTER — Emergency Department
Admission: EM | Admit: 2021-01-28 | Discharge: 2021-01-28 | Disposition: A | Discharge status: Home / Self Care | Payer: 59 | Attending: Student in an Organized Health Care Education/Training Program | Admitting: Student in an Organized Health Care Education/Training Program

## 2021-01-28 ENCOUNTER — Other Ambulatory Visit: Payer: Self-pay

## 2021-01-28 ENCOUNTER — Emergency Department: Payer: 59

## 2021-01-28 DIAGNOSIS — Z79899 Other long term (current) drug therapy: Secondary | ICD-10-CM | POA: Insufficient documentation

## 2021-01-28 DIAGNOSIS — Z87891 Personal history of nicotine dependence: Secondary | ICD-10-CM | POA: Diagnosis not present

## 2021-01-28 DIAGNOSIS — I5022 Chronic systolic (congestive) heart failure: Secondary | ICD-10-CM | POA: Diagnosis not present

## 2021-01-28 DIAGNOSIS — L03116 Cellulitis of left lower limb: Secondary | ICD-10-CM | POA: Diagnosis not present

## 2021-01-28 DIAGNOSIS — I110 Hypertensive heart disease with heart failure: Secondary | ICD-10-CM | POA: Diagnosis not present

## 2021-01-28 DIAGNOSIS — L03119 Cellulitis of unspecified part of limb: Secondary | ICD-10-CM

## 2021-01-28 DIAGNOSIS — M79672 Pain in left foot: Secondary | ICD-10-CM | POA: Diagnosis present

## 2021-01-28 DIAGNOSIS — Z7982 Long term (current) use of aspirin: Secondary | ICD-10-CM | POA: Insufficient documentation

## 2021-01-28 LAB — BASIC METABOLIC PANEL
Anion gap: 12 (ref 5–15)
BUN: 20 mg/dL (ref 8–23)
CO2: 25 mmol/L (ref 22–32)
Calcium: 9.6 mg/dL (ref 8.9–10.3)
Chloride: 106 mmol/L (ref 98–111)
Creatinine, Ser: 0.99 mg/dL (ref 0.44–1.00)
GFR, Estimated: >60 mL/min (ref >60)
Glucose, Bld: 104 mg/dL — ABNORMAL HIGH (ref 70–99)
Potassium: 4.5 mmol/L (ref 3.5–5.1)
Sodium: 143 mmol/L (ref 135–145)

## 2021-01-28 LAB — CBC WITH DIFFERENTIAL/PLATELET
Abs Immature Granulocytes: 0.02 10*3/uL (ref 0.00–0.07)
Basophils Absolute: 0.1 10*3/uL (ref 0.0–0.1)
Basophils Relative: 1 %
Eosinophils Absolute: 0.3 10*3/uL (ref 0.0–0.5)
Eosinophils Relative: 4 %
HCT: 36.5 % (ref 36.0–46.0)
Hemoglobin: 11.8 g/dL — ABNORMAL LOW (ref 12.0–15.0)
Immature Granulocytes: 0 %
Lymphocytes Relative: 34 %
Lymphs Abs: 2.8 10*3/uL (ref 0.7–4.0)
MCH: 28.5 pg (ref 26.0–34.0)
MCHC: 32.3 g/dL (ref 30.0–36.0)
MCV: 88.2 fL (ref 80.0–100.0)
Monocytes Absolute: 1 10*3/uL (ref 0.1–1.0)
Monocytes Relative: 12 %
Neutro Abs: 4 10*3/uL (ref 1.7–7.7)
Neutrophils Relative %: 49 %
Platelets: 358 10*3/uL (ref 150–400)
RBC: 4.14 MIL/uL (ref 3.87–5.11)
RDW: 16.6 % — ABNORMAL HIGH (ref 11.5–15.5)
WBC: 8.1 10*3/uL (ref 4.0–10.5)
nRBC: 0 % (ref 0.0–0.2)

## 2021-01-28 MED ORDER — CEPHALEXIN 500 MG PO CAPS: 500.0000 mg | ORAL_CAPSULE | Freq: Once | ORAL | Status: DC

## 2021-01-28 MED ORDER — CLINDAMYCIN HCL 150 MG PO CAPS
300.0000 mg | ORAL_CAPSULE | Freq: Once | ORAL | Status: AC
  Administered 2021-01-28: 300 mg via ORAL
  Filled 2021-01-28: qty 2

## 2021-01-28 MED ORDER — OXYCODONE-ACETAMINOPHEN 5-325 MG PO TABS: 1.0000 | ORAL_TABLET | ORAL | 0 refills | Status: AC | PRN

## 2021-01-28 MED ORDER — OXYCODONE-ACETAMINOPHEN 5-325 MG PO TABS
1.0000 | ORAL_TABLET | Freq: Once | ORAL | Status: AC
  Administered 2021-01-28: 1 via ORAL
  Filled 2021-01-28: qty 1

## 2021-01-28 MED ORDER — CLINDAMYCIN HCL 300 MG PO CAPS: 300.0000 mg | ORAL_CAPSULE | Freq: Three times a day (TID) | ORAL | 0 refills | Status: AC

## 2021-01-28 MED ORDER — PROBIOTIC 250 MG PO CAPS: 1.0000 | ORAL_CAPSULE | Freq: Two times a day (BID) | ORAL | 0 refills | Status: DC

## 2021-01-28 NOTE — ED Notes (Signed)
Pt c/o left foot pain and swelling that started Sunday, she reports "stumping it on my walker". Has tried ice, epsom salt, otc tylenol with no relief and states "pain is worse today than it has been and I just cant take it"

## 2021-01-28 NOTE — ED Triage Notes (Signed)
Pt via POV from home. Pt stumped her L big toe on Sunday, pt states she has been icing it and taking OTC with no relief. Pt ambulatory with walker. Pt is A&OX4 and NAD.

## 2021-01-28 NOTE — ED Provider Notes (Signed)
[facility] Emergency Department Provider Note    Event Date/Time   First MD Initiated Contact with Patient 01/28/21 1816     (approximate)  I have reviewed the triage vital signs and the nursing notes.   HISTORY  Chief Complaint Toe Pain    HPI Angie Webb is a 70 y.o. female with below listed past medical history presents to the ER for evaluation of pain in the left foot.  States that she stubbed her toe on her walker a few days ago and started having worsening redness and swelling since then.  She has the pain is mild to moderate.  No measured fevers or chills.  No history of diabetes.  No history of cellulitis or gout.  Past Medical History:  Diagnosis Date   Anemia    Anxiety    Bilateral knee pain    CHF (congestive heart failure) (HCC) 1999   Pt indicated hx of CHF since 1999   Chronic hepatitis C without hepatic coma (HCC)    Depression    Depression with anxiety    Glaucoma    Hx of hepatitis C 02/18/2015   Overview:  Started on Harvoni 03/26/14.  HepC GT 1a. Viral Load 390K. Liver elastrography:  F0. SVR at 24 weeks (resolved infection) Followed by [facility] Dr. Alycia Rossetti. S/P Harvoni treatments 2015-2016. History of IV drug use, illicit drug use.    Hypertension    Pt indicated hx of hypertension   Impaired fasting glucose 10/15/2015   Morbid obesity with BMI of 50.0-59.9, adult (HCC)    Primary osteoarthritis involving multiple joints    Family History  Problem Relation Age of Onset   Cancer Father    Hypertension Mother    Constipation Mother    Liver disease Brother    Past Surgical History:  Procedure Laterality Date   ABDOMINAL HYSTERECTOMY  04/1999   total   BREAST BIOPSY  6/11 & 6/13   Patient Active Problem List   Diagnosis Date Noted   Aortic atherosclerosis (HCC) 03/06/2019   Urinary incontinence in female 09/13/2016   Hypochromic erythrocytes 10/15/2015   Impaired fasting glucose 10/15/2015   Abnormal weight gain  09/30/2015   Medication monitoring encounter 09/30/2015   Weakness of both lower extremities 03/03/2015   Hx of hepatitis C 02/18/2015   Anxiety and depression 02/18/2015   Glaucoma 02/18/2015   Hypertension goal BP (blood pressure) < 140/90 02/18/2015   Morbid obesity with BMI of 50.0-59.9, adult (HCC) 02/18/2015   Obstructive sleep apnea of adult 02/18/2015   Chronic post-traumatic stress disorder 02/18/2015   Other bilateral secondary osteoarthritis of knee 02/18/2015   Allergic rhinitis 10/30/2012   Acid reflux 10/30/2012   Idiopathic localized osteoarthropathy 01/16/2012   Chronic systolic congestive heart failure, NYHA class 2 (HCC) 04/05/2011   Clinical depression 02/27/2009      Prior to Admission medications   Medication Sig Start Date End Date Taking? Authorizing Provider  clindamycin (CLEOCIN) 300 MG capsule Take 1 capsule (300 mg total) by mouth 3 (three) times daily for 10 days. 01/28/21 02/07/21 Yes Willy Eddy, MD  Saccharomyces boulardii (PROBIOTIC) 250 MG CAPS Take 1 capsule by mouth in the morning and at bedtime. 01/28/21  Yes Willy Eddy, MD  aspirin EC 81 MG tablet Take 81 mg by mouth. 09/03/08   [provider]  cloNIDine (CATAPRES) 0.2 MG tablet Take 0.2 mg by mouth 2 (two) times daily. 07/11/16   [provider]  diclofenac sodium (VOLTAREN) 1 %  GEL Apply topically. 07/11/16   [provider]  fluticasone (FLONASE) 50 MCG/ACT nasal spray Place 2 sprays into both nostrils daily. 01/30/18   Poulose, Percell Belt, NP  hydrochlorothiazide (HYDRODIURIL) 25 MG tablet Take 25 mg by mouth daily. 07/11/16 07/11/17  [provider]  labetalol (NORMODYNE) 100 MG tablet Take 1 tablet (100 mg total) by mouth 2 (two) times daily. 04/26/16   Lada, Janit Bern, MD  latanoprost (XALATAN) 0.005 % ophthalmic solution Apply to eye.    [provider]  lisinopril (PRINIVIL,ZESTRIL) 20 MG tablet Take 20 mg by mouth 2 (two) times daily. 07/11/16  07/11/17  [provider]  Misc. Devices [facility]) MISC 1 Device by Does not apply route daily. 03/03/15   Edwena Felty, MD  nystatin cream (MYCOSTATIN) Apply topically 2 (two) times daily as needed for dry skin. To affected area(s) 01/25/16   Lada, Janit Bern, MD  omeprazole (PRILOSEC) 20 MG capsule Take 1 capsule (20 mg total) by mouth daily. 09/14/16   Kerman Passey, MD  timolol (TIMOPTIC) 0.5 % ophthalmic solution  01/26/15   [provider]  torsemide (DEMADEX) 20 MG tablet 20 mg daily.  02/06/15   [provider]    Allergies Other and Bee venom    Social History Social History   Tobacco Use   Smoking status: Former    Packs/day: 1.00    Years: 30.00    Pack years: 30.00    Types: Cigarettes    Quit date: 2007    Years since quitting: 15.6   Smokeless tobacco: Never  Substance Use Topics   Alcohol use: No    Alcohol/week: 0.0 standard drinks   Drug use: No    Comment: successful prior treatmetn, patient has been clean for 68yrs.    Review of Systems Patient denies headaches, rhinorrhea, blurry vision, numbness, shortness of breath, chest pain, edema, cough, abdominal pain, nausea, vomiting, diarrhea, dysuria, fevers, rashes or hallucinations unless otherwise stated above in HPI. ____________________________________________   PHYSICAL EXAM:  VITAL SIGNS: Vitals:   01/28/21 1647 01/28/21 1819  BP: 133/84 (!) 158/86  Pulse: 88 (!) 107  Resp: 20 20  Temp: 98.2 F (36.8 C)   SpO2: 98% 99%    Constitutional: Alert and oriented. Well appearing and in no acute distress. Eyes: Conjunctivae are normal.  Head: Atraumatic. Nose: No congestion/rhinnorhea. Mouth/Throat: Mucous membranes are moist.   Neck: Painless ROM.  Cardiovascular:   Good peripheral circulation. Respiratory: Normal respiratory effort.  No retractions.  Gastrointestinal: Soft and nontender.  Musculoskeletal: Left foot does have area of erythema and swelling with warmth  the medial aspect of the foot no induration or fluctuance or crepitus.  Neurovascularly intact distally palpable DP pulse.  No joint effusions. Neurologic:  Normal speech and language. No gross focal neurologic deficits are appreciated.  Skin:  Skin is warm, dry and intact. No rash noted. Psychiatric: Mood and affect are normal. Speech and behavior are normal.  ____________________________________________   LABS (all labs ordered are listed, but only abnormal results are displayed)  No results found for this or any previous visit (from the past 24 hour(s)). ____________________________________________  EKG____________________________________________  RADIOLOGY  I personally reviewed all radiographic images ordered to evaluate for the above acute complaints and reviewed radiology reports and findings.  These findings were personally discussed with the patient.  Please see medical record for radiology report.  ____________________________________________   PROCEDURES  Procedure(s) performed:  Procedures    Critical Care performed: no ____________________________________________  INITIAL IMPRESSION / ASSESSMENT AND PLAN / ED COURSE  Pertinent labs & imaging results that were available during my care of the patient were reviewed by me and considered in my medical decision making (see chart for details).   DDX: Fracture, sprain, cellulitis, arthritis, limb ischemia, gout, septic arthritis  Jakylah D Bruney is a 70 y.o. who presents to the ED with presentation as described above.  Clinically seems most consistent with mild cellulitis.  No evidence of fracture.  Basic blood work ordered just for baseline.  Will place on antibiotics.  Does not seem consistent with DVT or ischemic limb.  Have lower suspicion for gout given lack of history and pain seems to be more related to midfoot and skin rather than actually over the joints.  Likewise less consistent with arthritis or septic joint.   Does appear clinically stable and appropriate for outpatient follow-up.  Patient agreeable to plan.  The patient was evaluated in Emergency Department today for the symptoms described in the history of present illness. He/she was evaluated in the context of the global COVID-19 pandemic, which necessitated consideration that the patient might be at risk for infection with the SARS-CoV-2 virus that causes COVID-19. Institutional protocols and algorithms that pertain to the evaluation of patients at risk for COVID-19 are in a state of rapid change based on information released by regulatory bodies including the CDC and federal and state organizations. These policies and algorithms were followed during the patient's care in the ED.       ____________________________________________   FINAL CLINICAL IMPRESSION(S) / ED DIAGNOSES  Final diagnoses:  Cellulitis of foot      NEW MEDICATIONS STARTED DURING THIS VISIT:  New Prescriptions   CLINDAMYCIN (CLEOCIN) 300 MG CAPSULE    Take 1 capsule (300 mg total) by mouth 3 (three) times daily for 10 days.   SACCHAROMYCES BOULARDII (PROBIOTIC) 250 MG CAPS    Take 1 capsule by mouth in the morning and at bedtime.     Note:  This document was prepared using Dragon voice recognition software and may include unintentional dictation errors.     Willy Eddy, MD 01/28/21 [phone]

## 2021-06-26 ENCOUNTER — Other Ambulatory Visit: Payer: Self-pay

## 2021-06-26 ENCOUNTER — Emergency Department: Payer: 59

## 2021-06-26 ENCOUNTER — Emergency Department
Admission: EM | Admit: 2021-06-26 | Discharge: 2021-06-26 | Disposition: A | Discharge status: Home / Self Care | Payer: 59 | Attending: Emergency Medicine | Admitting: Emergency Medicine

## 2021-06-26 DIAGNOSIS — R079 Chest pain, unspecified: Secondary | ICD-10-CM

## 2021-06-26 DIAGNOSIS — R0789 Other chest pain: Secondary | ICD-10-CM | POA: Insufficient documentation

## 2021-06-26 DIAGNOSIS — R0602 Shortness of breath: Secondary | ICD-10-CM | POA: Diagnosis not present

## 2021-06-26 DIAGNOSIS — I10 Essential (primary) hypertension: Secondary | ICD-10-CM | POA: Diagnosis not present

## 2021-06-26 LAB — BASIC METABOLIC PANEL
Anion gap: 10 (ref 5–15)
BUN: 24 mg/dL — ABNORMAL HIGH (ref 8–23)
CO2: 26 mmol/L (ref 22–32)
Calcium: 9.5 mg/dL (ref 8.9–10.3)
Chloride: 102 mmol/L (ref 98–111)
Creatinine, Ser: 1.09 mg/dL — ABNORMAL HIGH (ref 0.44–1.00)
GFR, Estimated: 55 mL/min — ABNORMAL LOW (ref >60)
Glucose, Bld: 120 mg/dL — ABNORMAL HIGH (ref 70–99)
Potassium: 4.4 mmol/L (ref 3.5–5.1)
Sodium: 138 mmol/L (ref 135–145)

## 2021-06-26 LAB — CBC
HCT: 37 % (ref 36.0–46.0)
Hemoglobin: 11.9 g/dL — ABNORMAL LOW (ref 12.0–15.0)
MCH: 28.2 pg (ref 26.0–34.0)
MCHC: 32.2 g/dL (ref 30.0–36.0)
MCV: 87.7 fL (ref 80.0–100.0)
Platelets: 321 10*3/uL (ref 150–400)
RBC: 4.22 MIL/uL (ref 3.87–5.11)
RDW: 17.1 % — ABNORMAL HIGH (ref 11.5–15.5)
WBC: 6.6 10*3/uL (ref 4.0–10.5)
nRBC: 0 % (ref 0.0–0.2)

## 2021-06-26 LAB — TROPONIN I (HIGH SENSITIVITY): Troponin I (High Sensitivity): 6 ng/L (ref <18)

## 2021-06-26 LAB — BRAIN NATRIURETIC PEPTIDE: B Natriuretic Peptide: 18.5 pg/mL (ref 0.0–100.0)

## 2021-06-26 NOTE — ED Triage Notes (Signed)
2 am today, pt c/o chest pain/pressure. Pt states she just started taking Celebrex, and has had CHF for years. Pt denies CP and SOB at this time, states her hands hurt. Pt is AOX4, ambulatory with walker, NAD noted.

## 2021-06-26 NOTE — ED Provider Notes (Signed)
[facility] Provider Note    Event Date/Time   First MD Initiated Contact with Patient 06/26/21 1800     (approximate)   History   Chest Pain   HPI Angie Webb is a 71 y.o. female with history of hypertension borderline diabetes morbid obesity who reports she has been under a lot of stress lately with her grandson being in her house with her.  He is not seeming to help very much.  She had an episode last night where she woke up in a cold sweat with chest tightness lasted about 5 minutes and went away.  She i has not anything like this before or since.      Physical Exam   Triage Vital Signs: ED Triage Vitals  Enc Vitals Group     BP 06/26/21 1155 (!) 152/91     Pulse Rate 06/26/21 1155 79     Resp 06/26/21 1155 20     Temp 06/26/21 1155 98.4 F (36.9 C)     Temp Source 06/26/21 1155 Oral     SpO2 06/26/21 1155 94 %     Weight --      Height --      Head Circumference --      Peak Flow --      Pain Score 06/26/21 1153 5     Pain Loc --      Pain Edu? --      Excl. in GC? --     Most recent vital signs: Vitals:   06/26/21 1545 06/26/21 1756  BP: (!) 154/83 (!) 158/73  Pulse: 81 92  Resp: 18 18  Temp: 97.8 F (36.6 C)   SpO2: 99% 100%     General: Awake, no distress.  Alert looks well and comfortable CV:  Good peripheral perfusion.  Heart regular rate and rhythm no audible murmurs Resp:  Normal effort.  Lungs are clear Abd:  No distention.   soft nontender Extremities no pitting edema     ED Results / Procedures / Treatments   Labs (all labs ordered are listed, but only abnormal results are displayed) Labs Reviewed  BASIC METABOLIC PANEL - Abnormal; Notable for the following components:      Result Value   Glucose, Bld 120 (*)    BUN 24 (*)    Creatinine, Ser 1.09 (*)    GFR, Estimated 55 (*)    All other components within normal limits  CBC - Abnormal; Notable for the following components:   Hemoglobin 11.9 (*)     RDW 17.1 (*)    All other components within normal limits  BRAIN NATRIURETIC PEPTIDE  TROPONIN I (HIGH SENSITIVITY)  TROPONIN I (HIGH SENSITIVITY)     EKG  EKG read interpreted by me shows normal sinus rhythm rate of 90 left axis no acute disease   RADIOLOGY Chest x-ray read by radiology reviewed by me shows no acute intrathoracic process   PROCEDURES:  Critical Care performed:   Procedures   MEDICATIONS ORDERED IN ED: Medications - No data to display   IMPRESSION / MDM / ASSESSMENT AND PLAN / ED COURSE  I reviewed the triage vital signs and the nursing notes.                              Differential diagnosis includes, but is not limited to, angina, NSTEMI.  PE is very unlikely based on the symptoms.  Patient  could have chest wall pain also.  Pleurisy is another possibility The patient is on the cardiac monitor although not for her old visit to evaluate for evidence of arrhythmia and/or significant heart rate changes. Patient was seen in the hallway  Electrolytes look okay.  Her GFR slightly low at 55.  She may be slightly dehydrated.  Blood glucose is 120 nothing to be overly worried about BNP was negative troponin was negative and her white count was negative.  Patient reports her cardiologist is Dr. Ivan Anchors.  I was going to repeat the troponin and then call him but we had a delay getting the troponin drawn because of another very ill patient and the patient that is Ms. Nibert appears to have left.  This is okay as I was going to discharge her.  She knows the plan of following up with her doctor and returning for any worsening symptoms.  We talked about her grandson and she is going to ask him to either contribute more by helping carry out the trash wash the dishes etc. were go somewhere else.  Apparently her grandson's mother had already kicked him out of the house.  I considered admitting the patient however based on her lab work and history I do not think we need to.  Initial  troponin was done quite sometime after the end of the chest tightness so most likely would have shown something if there had been an elevation in his troponin.  We did call the patient and attempt to contact her and asked her to come back for repeat test.  She was going to stop her celecoxib because she was worried about that causing cardiac problems.  She was taking that for left hand numbness and stiffness which is been going on for over a month.  Her doctor has been working with her on that.  I was considering starting her on aspirin but of course she has left and we cannot get a hold of her so I will not be able to do that.      FINAL CLINICAL IMPRESSION(S) / ED DIAGNOSES   Final diagnoses:  Chest pain, unspecified type     Rx / DC Orders   ED Discharge Orders     None        Note:  This document was prepared using Dragon voice recognition software and may include unintentional dictation errors.   Arnaldo Natal, MD 06/26/21 [phone]

## 2021-06-26 NOTE — ED Provider Triage Note (Signed)
Emergency Medicine Provider Triage Evaluation Note  Angie Webb , a 71 y.o. female  was evaluated in triage.  Pt complains of acute onset chest pain that started at 2 AM.  Describes the chest pain as sharp and stabbing.  The pain does not radiate.  She reports associated hand pain now but this just started.  She denies headache, dizziness, vision changes, shortness of breath, nausea, vomiting or worsening lower extremity edema.  She has CHF.  Review of Systems  Positive: Chest pain, hand pain Negative: Headache, dizziness, vision changes, shortness of breath, nausea, vomiting, worse than normal lower extremity edema.  Physical Exam  BP (!) 152/91    Pulse 79    Temp 98.4 F (36.9 C) (Oral)    Resp 20    SpO2 94%  Gen:   Awake, anxious appearing but in NAD Resp:  Normal effort  Cardio:  RRR, no murmur   Medical Decision Making  Medically screening exam initiated at 12:02 PM.  Appropriate orders placed.  Amani D Besaw was informed that the remainder of the evaluation will be completed by another provider, this initial triage assessment does not replace that evaluation, and the importance of remaining in the ED until their evaluation is complete.  Chest Pain, Hx of CHF:  ECG in triage normal CBC, c-Met, troponin and BNP Chest x-ray   Jearld Fenton, NP 06/26/21 1210

## 2021-06-26 NOTE — ED Notes (Signed)
Pt has not been at bed since at least 650pm per previous nurse. Checked floor and bathroom. Also called patient at mobile and home phone and was unable to reach. Informed charge nurse and move pt OTF.

## 2021-07-07 ENCOUNTER — Other Ambulatory Visit: Payer: Self-pay | Admitting: Internal Medicine

## 2021-07-07 DIAGNOSIS — R0602 Shortness of breath: Secondary | ICD-10-CM

## 2021-07-07 DIAGNOSIS — I208 Other forms of angina pectoris: Secondary | ICD-10-CM

## 2021-07-15 ENCOUNTER — Other Ambulatory Visit: Payer: Self-pay | Admitting: Internal Medicine

## 2021-07-15 DIAGNOSIS — R0602 Shortness of breath: Secondary | ICD-10-CM

## 2021-07-15 DIAGNOSIS — I208 Other forms of angina pectoris: Secondary | ICD-10-CM

## 2021-07-20 ENCOUNTER — Ambulatory Visit: Payer: Medicare Other

## 2021-07-27 ENCOUNTER — Other Ambulatory Visit: Payer: Self-pay

## 2021-07-27 ENCOUNTER — Encounter
Admission: RE | Admit: 2021-07-27 | Discharge: 2021-07-27 | Disposition: A | Discharge status: Home / Self Care | Payer: Medicare Other | Source: Ambulatory Visit | Attending: Internal Medicine | Admitting: Internal Medicine

## 2021-07-27 ENCOUNTER — Ambulatory Visit: Payer: Medicare Other

## 2021-07-27 DIAGNOSIS — I2089 Other forms of angina pectoris: Secondary | ICD-10-CM

## 2021-07-27 DIAGNOSIS — R0602 Shortness of breath: Secondary | ICD-10-CM

## 2021-07-27 DIAGNOSIS — I208 Other forms of angina pectoris: Secondary | ICD-10-CM | POA: Insufficient documentation

## 2022-04-12 ENCOUNTER — Ambulatory Visit (LOCAL_COMMUNITY_HEALTH_CENTER): Payer: Medicare Other

## 2022-04-12 DIAGNOSIS — Z23 Encounter for immunization: Secondary | ICD-10-CM

## 2022-04-12 DIAGNOSIS — Z719 Counseling, unspecified: Secondary | ICD-10-CM

## 2022-04-12 NOTE — Progress Notes (Signed)
  Are you feeling sick today? No   Have you ever received a dose of COVID-19 Vaccine? [employer], [place], [place], [place], Other) Yes  If yes, which vaccine and how many doses?    Moderna 4 doses  Did you bring the vaccination record card or other documentation?  Yes   Do you have a health condition or are undergoing treatment that makes you moderately or severely immunocompromised? This would include, but not be limited to: cancer, HIV, organ transplant, immunosuppressive therapy/high-dose corticosteroids, or moderate/severe primary immunodeficiency.  No  Have you received COVID-19 vaccine before or during hematopoietic cell transplant (HCT) or CAR-T-cell therapies? No  Have you ever had an allergic reaction to: (This would include a severe allergic reaction or a reaction that caused hives, swelling, or respiratory distress, including wheezing.) A component of a COVID-19 vaccine or a previous dose of COVID-19 vaccine? No   Have you ever had an allergic reaction to another vaccine (other thanCOVID-19 vaccine) or an injectable medication? (This would include a severe allergic reaction or a reaction that caused hives, swelling, or respiratory distress, including wheezing.)   No    Do you have a history of any of the following:  Myocarditis or Pericarditis No  Dermal fillers:  No  Multisystem Inflammatory Syndrome (MIS-C or MIS-A)? No  COVID-19 disease within the past 3 months? No  Vaccinated with monkeypox vaccine in the last 4 weeks? No   Moderna Spikevax 2023-24 formula administered; tolerated well.  VIS given and updated [facility] provided.  Stayed 30 minutes for observation and did well.    Tonny Branch, RN

## 2022-06-27 ENCOUNTER — Ambulatory Visit: Payer: 59

## 2023-05-26 IMAGING — DX DG FOOT COMPLETE 3+V*L*
3 series · 3 of 3 positions shown · non-contrast
Comparison: 01/18/2005

CLINICAL DATA: Injured left great toe 4 days ago, continued pain

EXAM:
LEFT FOOT - COMPLETE 3+ VIEW

[foot ap]
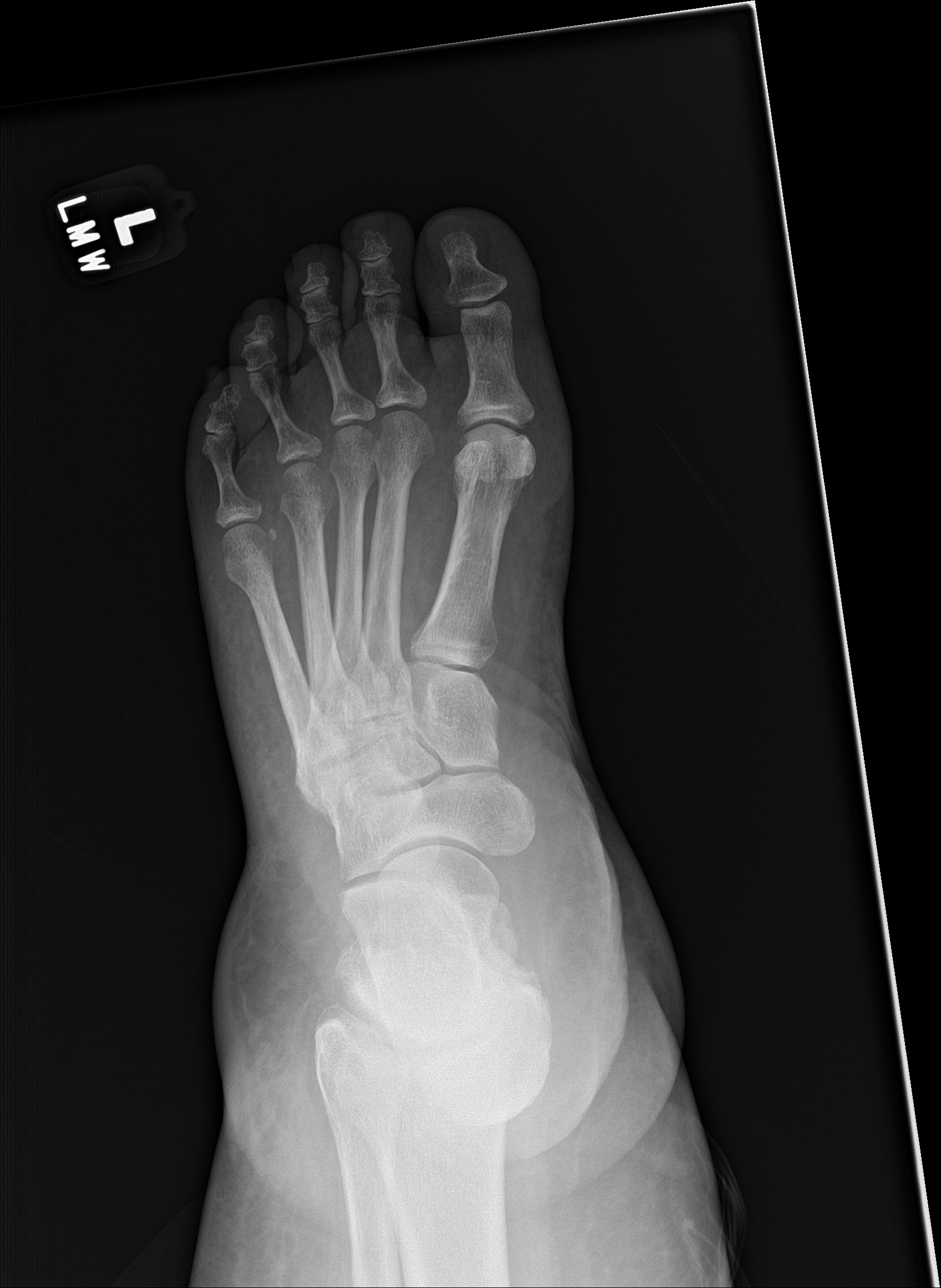

[foot obl]
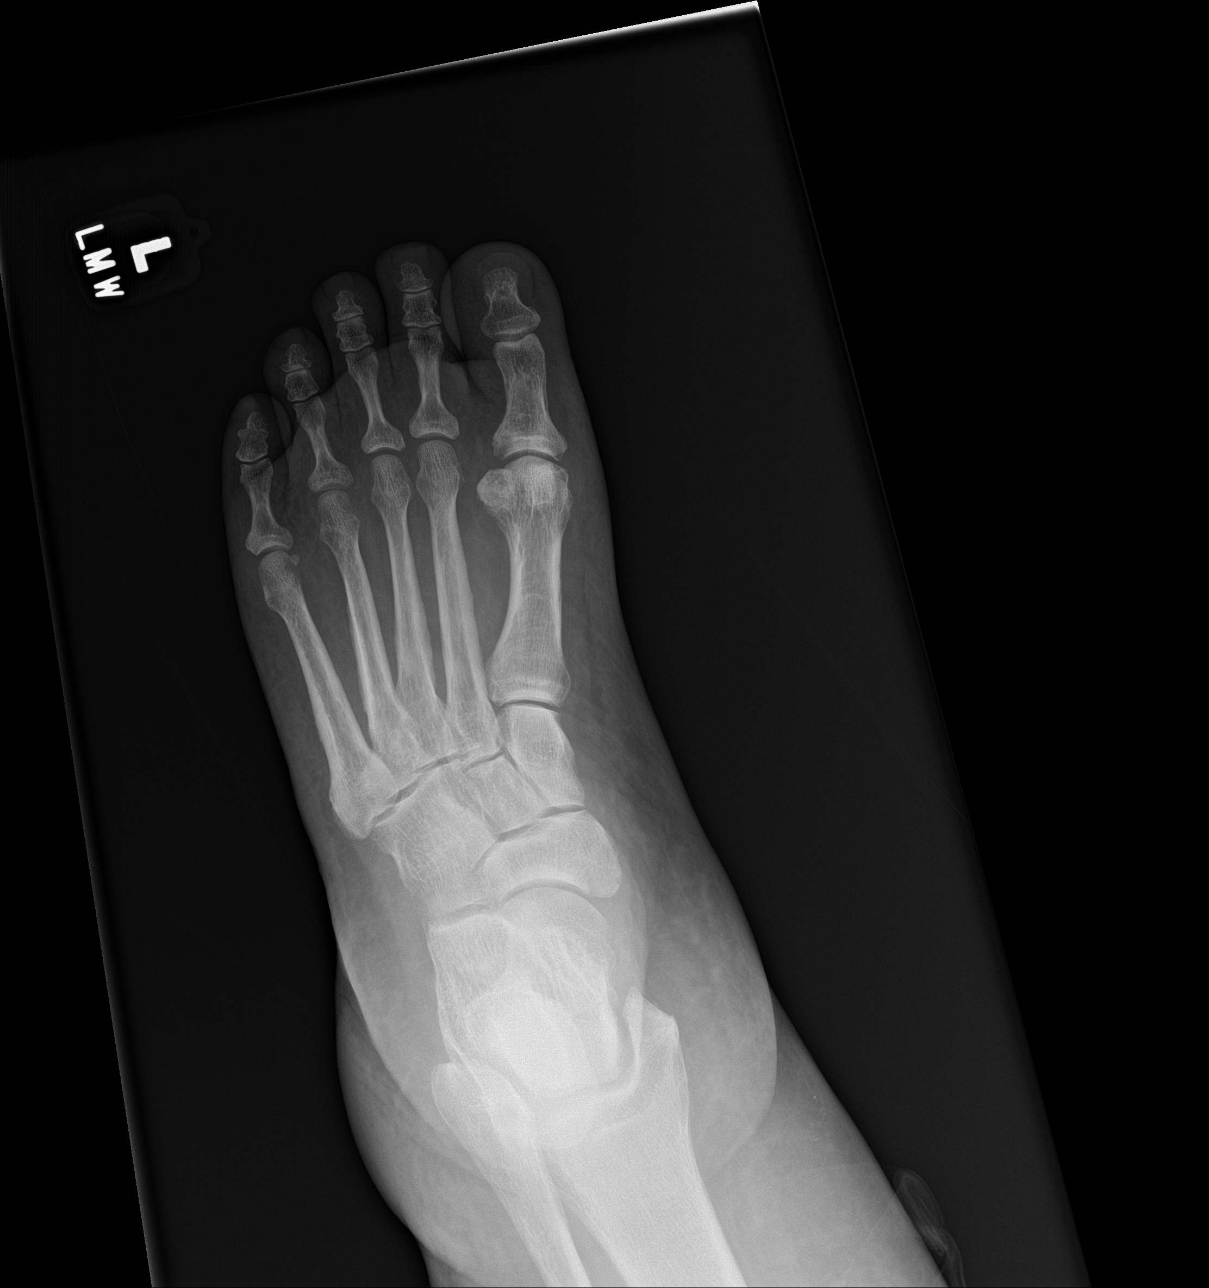

[foot lat]
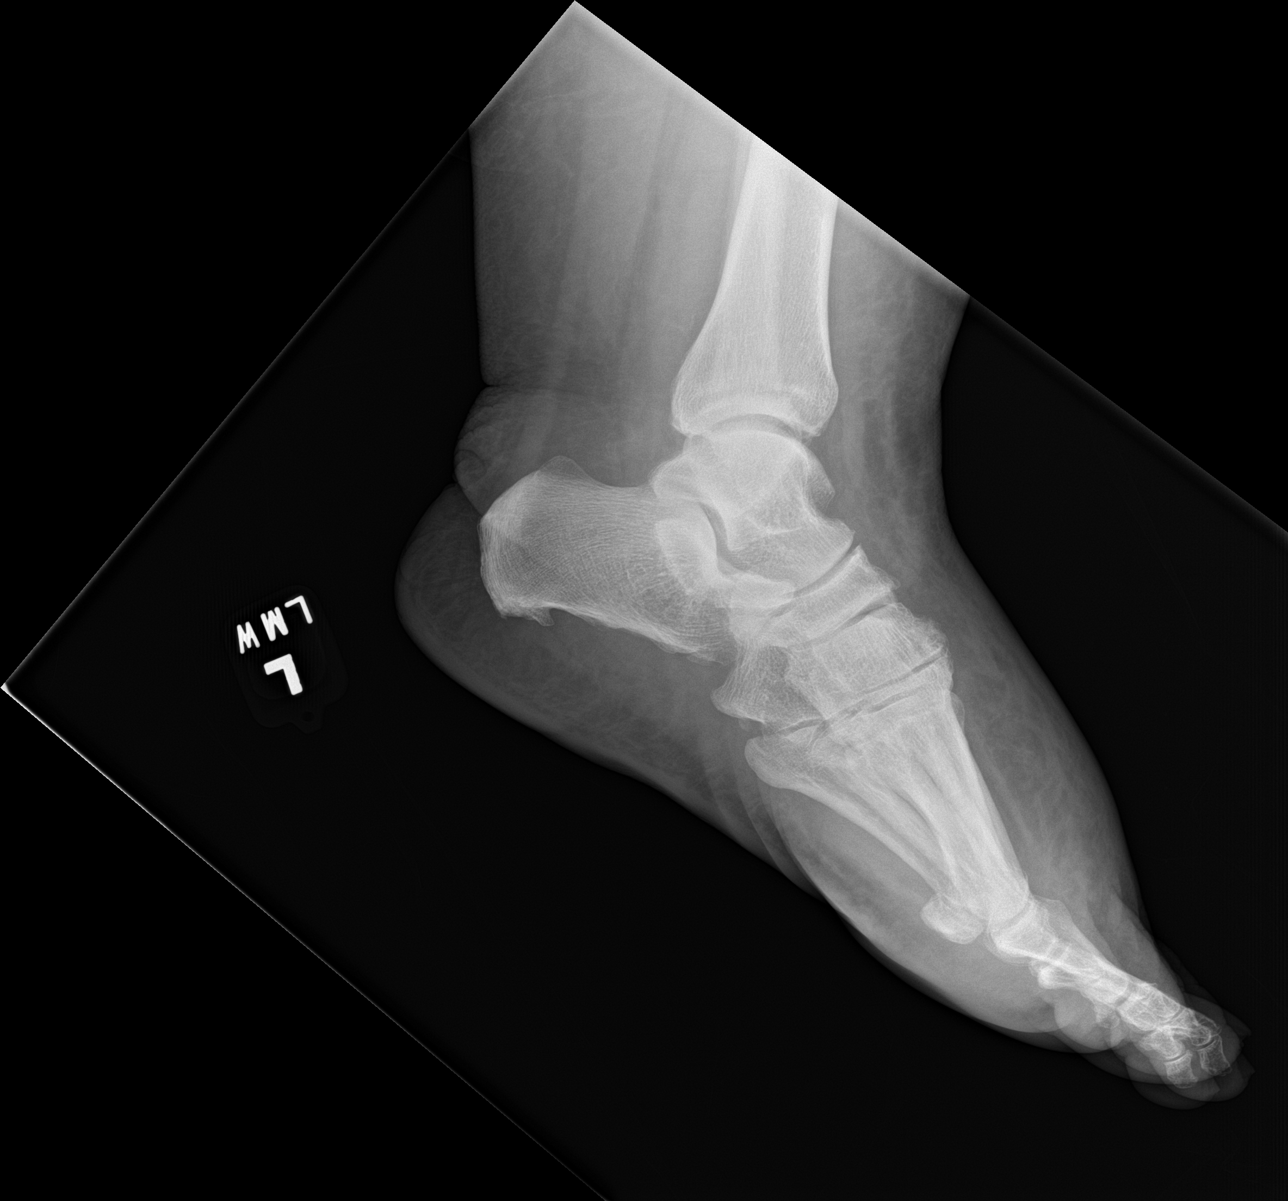

[3 of 3 positions shown; findings below may reference images not displayed]

FINDINGS: Frontal, oblique, and lateral views of the left foot are obtained.
No acute fracture, subluxation, or dislocation. There is mild
midfoot osteoarthritis at the level of the tarsometatarsal joints.
There is diffuse soft tissue swelling, greatest within the dorsal
aspect of the forefoot.
IMPRESSION: 1. Dorsal soft tissue swelling of the forefoot.
2. No acute displaced fracture.
3. Mild osteoarthritis at the tarsal metatarsal joints.

## 2023-06-29 ENCOUNTER — Emergency Department: Payer: 59

## 2023-06-29 ENCOUNTER — Emergency Department
Admission: EM | Admit: 2023-06-29 | Discharge: 2023-06-29 | Disposition: A | Discharge status: Home / Self Care | Payer: 59 | Attending: Emergency Medicine | Admitting: Emergency Medicine

## 2023-06-29 ENCOUNTER — Encounter: Payer: Self-pay | Admitting: Emergency Medicine

## 2023-06-29 ENCOUNTER — Other Ambulatory Visit: Payer: Self-pay

## 2023-06-29 DIAGNOSIS — W19XXXA Unspecified fall, initial encounter: Secondary | ICD-10-CM | POA: Diagnosis not present

## 2023-06-29 DIAGNOSIS — R600 Localized edema: Secondary | ICD-10-CM | POA: Diagnosis not present

## 2023-06-29 DIAGNOSIS — W07XXXA Fall from chair, initial encounter: Secondary | ICD-10-CM | POA: Diagnosis not present

## 2023-06-29 DIAGNOSIS — E119 Type 2 diabetes mellitus without complications: Secondary | ICD-10-CM | POA: Diagnosis not present

## 2023-06-29 DIAGNOSIS — M791 Myalgia, unspecified site: Secondary | ICD-10-CM | POA: Diagnosis not present

## 2023-06-29 DIAGNOSIS — M25572 Pain in left ankle and joints of left foot: Secondary | ICD-10-CM | POA: Insufficient documentation

## 2023-06-29 DIAGNOSIS — S99912A Unspecified injury of left ankle, initial encounter: Secondary | ICD-10-CM | POA: Diagnosis not present

## 2023-06-29 DIAGNOSIS — M19072 Primary osteoarthritis, left ankle and foot: Secondary | ICD-10-CM | POA: Diagnosis not present

## 2023-06-29 DIAGNOSIS — I10 Essential (primary) hypertension: Secondary | ICD-10-CM | POA: Insufficient documentation

## 2023-06-29 DIAGNOSIS — Z20822 Contact with and (suspected) exposure to covid-19: Secondary | ICD-10-CM | POA: Insufficient documentation

## 2023-06-29 DIAGNOSIS — R531 Weakness: Secondary | ICD-10-CM | POA: Diagnosis not present

## 2023-06-29 LAB — RESP PANEL BY RT-PCR (RSV, FLU A&B, COVID)  RVPGX2
Influenza A by PCR: NEGATIVE
Influenza B by PCR: NEGATIVE
Resp Syncytial Virus by PCR: NEGATIVE
SARS Coronavirus 2 by RT PCR: NEGATIVE

## 2023-06-29 NOTE — ED Notes (Signed)
See triage note  Presents with some body aches for couple of days  Afebrile on arrival

## 2023-06-29 NOTE — Discharge Instructions (Addendum)
Take your diuretic pills as prescribed.  Follow-up with your primary care provider.  Return to the ER for new, worsening, or persistent severe body aches, fever, weakness, vomiting, leg swelling, difficulty breathing, or any other new or worsening symptoms that concern you.

## 2023-06-29 NOTE — ED Triage Notes (Signed)
First Nurse Note: Patient to ED via [facility] from home for a fall that occurred on Sunday. Patient fell after a leg broke on her chair. She did hit her head but denies LOC or blood thinners. C/o of aching all over and left ankle weakness. VS WNL and Aox4 with EMS.

## 2023-06-29 NOTE — ED Provider Notes (Signed)
[facility] Provider Note    Event Date/Time   First MD Initiated Contact with Patient 06/29/23 1629     (approximate)   History   Generalized Body Aches   HPI  Angie Webb is a 73 y.o. female with a history of hypertension, diabetes, edema who presents with left ankle pain and bodyaches.  The patient states that she fell from a chair 4 days ago when one of the legs broke.  She did hit her head but did not have LOC.  She denies any headache or vomiting.  She twisted her left ankle and has had pain there since the fall although it is improving.  She also started having generalized bodyaches and malaise the same evening.  She denies associated cough, shortness of breath, vomiting or diarrhea.  She has no nasal congestion or sore throat.  She states the buttocks have significant improved over the last day and are now minimal.  She states that because she did not want to get up and walk on the ankle she stopped taking her hydrochlorothiazide and torsemide for the last several days so she has had some increased leg swelling.  She denies any associated shortness of breath or chest pain.  I reviewed the past medical records.  The patient's most recent outpatient encounter was on 04/12/2022 for a COVID-vaccine.  She has no recent ED visits or admissions.   Physical Exam   Triage Vital Signs: ED Triage Vitals  Encounter Vitals Group     BP 06/29/23 1440 (!) 184/84     Systolic BP Percentile --      Diastolic BP Percentile --      Pulse Rate 06/29/23 1440 82     Resp 06/29/23 1440 18     Temp 06/29/23 1440 98.1 F (36.7 C)     Temp Source 06/29/23 1440 Oral     SpO2 06/29/23 1440 95 %     Weight 06/29/23 1634 (!) 374 lb 9 oz (169.9 kg)     Height 06/29/23 1634 5\' 3"  (1.6 m)     Head Circumference --      Peak Flow --      Pain Score --      Pain Loc --      Pain Education --      Exclude from Growth Chart --     Most recent vital signs: Vitals:    06/29/23 1440  BP: (!) 184/84  Pulse: 82  Resp: 18  Temp: 98.1 F (36.7 C)  SpO2: 95%     General: Awake, no distress.  CV:  Good peripheral perfusion.  Resp:  Normal effort. Abd:  No distention.  Other:  EOMI.  PR light.  No photophobia.  Normal speech.  No facial droop.  Motor intact in all extremities.  1+ bilateral lower extremity edema.  Left ankle with no deformity or tenderness.  Full range of motion.  Normal cap refill.  2+ DP pulse.   ED Results / Procedures / Treatments   Labs (all labs ordered are listed, but only abnormal results are displayed) Labs Reviewed  RESP PANEL BY RT-PCR (RSV, FLU A&B, COVID)  [id number]     EKG     RADIOLOGY  XR L ankle: I independently viewed and interpreted the images; there is no acute fracture  PROCEDURES:  Critical Care performed: No  Procedures   MEDICATIONS ORDERED IN ED: Medications - No data to display   IMPRESSION / MDM / ASSESSMENT  AND PLAN / ED COURSE  I reviewed the triage vital signs and the nursing notes.  73 year old female with PMH as noted above presents with bodyaches over the last several days that are now resolving as well as with a left ankle injury from 4 days ago with no acute pain.  She is able to bear weight without difficulty.  On exam she is hypertensive with otherwise normal vital signs.  Left lower extremities neuro/vascular intact.  She has chronic lower extremity edema bilaterally.  Differential diagnosis includes, but is not limited to, ankle sprain, contusion, fracture.  X-rays negative.  Given that the patient has good range of motion and is now bearing weight, there is no indication for further workup.  The body aches are most consistent with a viral syndrome or other benign etiology.  Respiratory panel is negative.  This symptom is also resolving and the patient reports significant improvement over the last day.  I did discuss with her whether there would be any benefit in ordering labs,  however given her improving symptoms, reassuring vital signs, and lack of any focal symptoms, there is no strong indication for specific lab workup.  The patient herself states she is feeling much better than she has over the last few days and would like to go home.  She declines further workup.  Patient's presentation is most consistent with acute complicated illness / injury requiring diagnostic workup.  At this time, the patient is stable for discharge home.  I gave her strict return precautions and she expressed understanding.  She agrees to resume her diuretics.  She will follow-up with her primary care provider.   FINAL CLINICAL IMPRESSION(S) / ED DIAGNOSES   Final diagnoses:  Myalgia  Injury of left ankle, initial encounter     Rx / DC Orders   ED Discharge Orders     None        Note:  This document was prepared using Dragon voice recognition software and may include unintentional dictation errors.    Dionne Bucy, MD 06/29/23 1715

## 2023-06-29 NOTE — ED Triage Notes (Signed)
Pt here with body aches and a left ankle pain. Pt states she fell on Sunday but did not feel pain until Monday. Pt has not been able to stand up since Sunday. Pt also having body aches but denies any other symptoms.

## 2023-07-05 DIAGNOSIS — I5032 Chronic diastolic (congestive) heart failure: Secondary | ICD-10-CM | POA: Diagnosis not present

## 2023-07-05 DIAGNOSIS — R5381 Other malaise: Secondary | ICD-10-CM | POA: Diagnosis not present

## 2023-07-06 DIAGNOSIS — G4733 Obstructive sleep apnea (adult) (pediatric): Secondary | ICD-10-CM | POA: Diagnosis not present

## 2023-07-06 DIAGNOSIS — M8580 Other specified disorders of bone density and structure, unspecified site: Secondary | ICD-10-CM | POA: Diagnosis not present

## 2023-07-06 DIAGNOSIS — R5381 Other malaise: Secondary | ICD-10-CM | POA: Diagnosis not present

## 2023-07-06 NOTE — Progress Notes (Signed)
Presented with bodyaches, viral panel ordered as possible etiology.

## 2023-07-10 DIAGNOSIS — H409 Unspecified glaucoma: Secondary | ICD-10-CM | POA: Diagnosis not present

## 2023-07-10 DIAGNOSIS — Z7409 Other reduced mobility: Secondary | ICD-10-CM | POA: Diagnosis not present

## 2023-07-10 DIAGNOSIS — Z7951 Long term (current) use of inhaled steroids: Secondary | ICD-10-CM | POA: Diagnosis not present

## 2023-07-10 DIAGNOSIS — M25572 Pain in left ankle and joints of left foot: Secondary | ICD-10-CM | POA: Diagnosis not present

## 2023-07-10 DIAGNOSIS — R262 Difficulty in walking, not elsewhere classified: Secondary | ICD-10-CM | POA: Diagnosis not present

## 2023-07-10 DIAGNOSIS — Z8619 Personal history of other infectious and parasitic diseases: Secondary | ICD-10-CM | POA: Diagnosis not present

## 2023-07-10 DIAGNOSIS — Z87891 Personal history of nicotine dependence: Secondary | ICD-10-CM | POA: Diagnosis not present

## 2023-07-10 DIAGNOSIS — J9811 Atelectasis: Secondary | ICD-10-CM | POA: Diagnosis not present

## 2023-07-10 DIAGNOSIS — Z833 Family history of diabetes mellitus: Secondary | ICD-10-CM | POA: Diagnosis not present

## 2023-07-10 DIAGNOSIS — M7989 Other specified soft tissue disorders: Secondary | ICD-10-CM | POA: Diagnosis not present

## 2023-07-10 DIAGNOSIS — R600 Localized edema: Secondary | ICD-10-CM | POA: Diagnosis not present

## 2023-07-10 DIAGNOSIS — S93402A Sprain of unspecified ligament of left ankle, initial encounter: Secondary | ICD-10-CM | POA: Diagnosis not present

## 2023-07-10 DIAGNOSIS — R8271 Bacteriuria: Secondary | ICD-10-CM | POA: Diagnosis not present

## 2023-07-10 DIAGNOSIS — Z7982 Long term (current) use of aspirin: Secondary | ICD-10-CM | POA: Diagnosis not present

## 2023-07-10 DIAGNOSIS — R9431 Abnormal electrocardiogram [ECG] [EKG]: Secondary | ICD-10-CM | POA: Diagnosis not present

## 2023-07-10 DIAGNOSIS — I700 Atherosclerosis of aorta: Secondary | ICD-10-CM | POA: Diagnosis not present

## 2023-07-10 DIAGNOSIS — R6889 Other general symptoms and signs: Secondary | ICD-10-CM | POA: Diagnosis not present

## 2023-07-10 DIAGNOSIS — G4733 Obstructive sleep apnea (adult) (pediatric): Secondary | ICD-10-CM | POA: Diagnosis not present

## 2023-07-10 DIAGNOSIS — R5381 Other malaise: Secondary | ICD-10-CM | POA: Diagnosis not present

## 2023-07-10 DIAGNOSIS — I5032 Chronic diastolic (congestive) heart failure: Secondary | ICD-10-CM | POA: Diagnosis not present

## 2023-07-10 DIAGNOSIS — Z9989 Dependence on other enabling machines and devices: Secondary | ICD-10-CM | POA: Diagnosis not present

## 2023-07-10 DIAGNOSIS — R8281 Pyuria: Secondary | ICD-10-CM | POA: Diagnosis not present

## 2023-07-10 DIAGNOSIS — M6281 Muscle weakness (generalized): Secondary | ICD-10-CM | POA: Diagnosis not present

## 2023-07-10 DIAGNOSIS — Z79899 Other long term (current) drug therapy: Secondary | ICD-10-CM | POA: Diagnosis not present

## 2023-07-10 DIAGNOSIS — I110 Hypertensive heart disease with heart failure: Secondary | ICD-10-CM | POA: Diagnosis not present

## 2023-07-11 DIAGNOSIS — S93402D Sprain of unspecified ligament of left ankle, subsequent encounter: Secondary | ICD-10-CM | POA: Diagnosis not present

## 2023-07-11 DIAGNOSIS — R8281 Pyuria: Secondary | ICD-10-CM | POA: Diagnosis not present

## 2023-07-11 DIAGNOSIS — Z7409 Other reduced mobility: Secondary | ICD-10-CM | POA: Diagnosis not present

## 2023-07-11 DIAGNOSIS — R600 Localized edema: Secondary | ICD-10-CM | POA: Diagnosis not present

## 2023-07-11 DIAGNOSIS — R5381 Other malaise: Secondary | ICD-10-CM | POA: Diagnosis not present

## 2023-07-12 DIAGNOSIS — S93402D Sprain of unspecified ligament of left ankle, subsequent encounter: Secondary | ICD-10-CM | POA: Diagnosis not present

## 2023-07-12 DIAGNOSIS — I110 Hypertensive heart disease with heart failure: Secondary | ICD-10-CM | POA: Diagnosis not present

## 2023-07-12 DIAGNOSIS — I5032 Chronic diastolic (congestive) heart failure: Secondary | ICD-10-CM | POA: Diagnosis not present

## 2023-07-12 DIAGNOSIS — R5381 Other malaise: Secondary | ICD-10-CM | POA: Diagnosis not present

## 2023-07-13 DIAGNOSIS — I5030 Unspecified diastolic (congestive) heart failure: Secondary | ICD-10-CM | POA: Diagnosis not present

## 2023-07-13 DIAGNOSIS — R8271 Bacteriuria: Secondary | ICD-10-CM | POA: Diagnosis not present

## 2023-07-13 DIAGNOSIS — I110 Hypertensive heart disease with heart failure: Secondary | ICD-10-CM | POA: Diagnosis not present

## 2023-07-13 DIAGNOSIS — S93402A Sprain of unspecified ligament of left ankle, initial encounter: Secondary | ICD-10-CM | POA: Diagnosis not present

## 2023-07-14 DIAGNOSIS — S93402D Sprain of unspecified ligament of left ankle, subsequent encounter: Secondary | ICD-10-CM | POA: Diagnosis not present

## 2023-07-14 DIAGNOSIS — R262 Difficulty in walking, not elsewhere classified: Secondary | ICD-10-CM | POA: Diagnosis not present

## 2023-07-14 DIAGNOSIS — M6250 Muscle wasting and atrophy, not elsewhere classified, unspecified site: Secondary | ICD-10-CM | POA: Diagnosis not present

## 2023-07-14 DIAGNOSIS — I5032 Chronic diastolic (congestive) heart failure: Secondary | ICD-10-CM | POA: Diagnosis not present

## 2023-07-14 DIAGNOSIS — H409 Unspecified glaucoma: Secondary | ICD-10-CM | POA: Diagnosis not present

## 2023-07-14 DIAGNOSIS — Z9989 Dependence on other enabling machines and devices: Secondary | ICD-10-CM | POA: Diagnosis not present

## 2023-07-14 DIAGNOSIS — I700 Atherosclerosis of aorta: Secondary | ICD-10-CM | POA: Diagnosis not present

## 2023-07-14 DIAGNOSIS — Z833 Family history of diabetes mellitus: Secondary | ICD-10-CM | POA: Diagnosis not present

## 2023-07-14 DIAGNOSIS — Z7409 Other reduced mobility: Secondary | ICD-10-CM | POA: Diagnosis not present

## 2023-07-14 DIAGNOSIS — R8271 Bacteriuria: Secondary | ICD-10-CM | POA: Diagnosis not present

## 2023-07-14 DIAGNOSIS — S93402A Sprain of unspecified ligament of left ankle, initial encounter: Secondary | ICD-10-CM | POA: Diagnosis not present

## 2023-07-14 DIAGNOSIS — G8911 Acute pain due to trauma: Secondary | ICD-10-CM | POA: Diagnosis not present

## 2023-07-14 DIAGNOSIS — J9811 Atelectasis: Secondary | ICD-10-CM | POA: Diagnosis not present

## 2023-07-14 DIAGNOSIS — G4733 Obstructive sleep apnea (adult) (pediatric): Secondary | ICD-10-CM | POA: Diagnosis not present

## 2023-07-14 DIAGNOSIS — I5030 Unspecified diastolic (congestive) heart failure: Secondary | ICD-10-CM | POA: Diagnosis not present

## 2023-07-14 DIAGNOSIS — I10 Essential (primary) hypertension: Secondary | ICD-10-CM | POA: Diagnosis not present

## 2023-07-14 DIAGNOSIS — Z7982 Long term (current) use of aspirin: Secondary | ICD-10-CM | POA: Diagnosis not present

## 2023-07-14 DIAGNOSIS — Z87891 Personal history of nicotine dependence: Secondary | ICD-10-CM | POA: Diagnosis not present

## 2023-07-14 DIAGNOSIS — Z7951 Long term (current) use of inhaled steroids: Secondary | ICD-10-CM | POA: Diagnosis not present

## 2023-07-14 DIAGNOSIS — R600 Localized edema: Secondary | ICD-10-CM | POA: Diagnosis not present

## 2023-07-14 DIAGNOSIS — Z8619 Personal history of other infectious and parasitic diseases: Secondary | ICD-10-CM | POA: Diagnosis not present

## 2023-07-14 DIAGNOSIS — Z79899 Other long term (current) drug therapy: Secondary | ICD-10-CM | POA: Diagnosis not present

## 2023-07-14 DIAGNOSIS — R531 Weakness: Secondary | ICD-10-CM | POA: Diagnosis not present

## 2023-07-14 DIAGNOSIS — R8281 Pyuria: Secondary | ICD-10-CM | POA: Diagnosis not present

## 2023-07-14 DIAGNOSIS — H401130 Primary open-angle glaucoma, bilateral, stage unspecified: Secondary | ICD-10-CM | POA: Diagnosis not present

## 2023-07-14 DIAGNOSIS — I110 Hypertensive heart disease with heart failure: Secondary | ICD-10-CM | POA: Diagnosis not present

## 2023-07-14 DIAGNOSIS — M6281 Muscle weakness (generalized): Secondary | ICD-10-CM | POA: Diagnosis not present

## 2023-07-28 DIAGNOSIS — G4733 Obstructive sleep apnea (adult) (pediatric): Secondary | ICD-10-CM | POA: Diagnosis not present

## 2023-07-31 DIAGNOSIS — S93402D Sprain of unspecified ligament of left ankle, subsequent encounter: Secondary | ICD-10-CM | POA: Diagnosis not present

## 2023-07-31 DIAGNOSIS — I890 Lymphedema, not elsewhere classified: Secondary | ICD-10-CM | POA: Diagnosis not present

## 2023-07-31 DIAGNOSIS — Z79899 Other long term (current) drug therapy: Secondary | ICD-10-CM | POA: Diagnosis not present

## 2023-07-31 DIAGNOSIS — I5032 Chronic diastolic (congestive) heart failure: Secondary | ICD-10-CM | POA: Diagnosis not present

## 2023-07-31 DIAGNOSIS — Z7982 Long term (current) use of aspirin: Secondary | ICD-10-CM | POA: Diagnosis not present

## 2023-07-31 DIAGNOSIS — G4733 Obstructive sleep apnea (adult) (pediatric): Secondary | ICD-10-CM | POA: Diagnosis not present

## 2023-07-31 DIAGNOSIS — H409 Unspecified glaucoma: Secondary | ICD-10-CM | POA: Diagnosis not present

## 2023-07-31 DIAGNOSIS — J9811 Atelectasis: Secondary | ICD-10-CM | POA: Diagnosis not present

## 2023-07-31 DIAGNOSIS — Z9181 History of falling: Secondary | ICD-10-CM | POA: Diagnosis not present

## 2023-07-31 DIAGNOSIS — I110 Hypertensive heart disease with heart failure: Secondary | ICD-10-CM | POA: Diagnosis not present

## 2023-08-02 DIAGNOSIS — I5032 Chronic diastolic (congestive) heart failure: Secondary | ICD-10-CM | POA: Diagnosis not present

## 2023-08-02 DIAGNOSIS — H409 Unspecified glaucoma: Secondary | ICD-10-CM | POA: Diagnosis not present

## 2023-08-02 DIAGNOSIS — Z7982 Long term (current) use of aspirin: Secondary | ICD-10-CM | POA: Diagnosis not present

## 2023-08-02 DIAGNOSIS — G4733 Obstructive sleep apnea (adult) (pediatric): Secondary | ICD-10-CM | POA: Diagnosis not present

## 2023-08-02 DIAGNOSIS — I890 Lymphedema, not elsewhere classified: Secondary | ICD-10-CM | POA: Diagnosis not present

## 2023-08-02 DIAGNOSIS — Z79899 Other long term (current) drug therapy: Secondary | ICD-10-CM | POA: Diagnosis not present

## 2023-08-02 DIAGNOSIS — Z9181 History of falling: Secondary | ICD-10-CM | POA: Diagnosis not present

## 2023-08-02 DIAGNOSIS — S93402D Sprain of unspecified ligament of left ankle, subsequent encounter: Secondary | ICD-10-CM | POA: Diagnosis not present

## 2023-08-02 DIAGNOSIS — J9811 Atelectasis: Secondary | ICD-10-CM | POA: Diagnosis not present

## 2023-08-02 DIAGNOSIS — I110 Hypertensive heart disease with heart failure: Secondary | ICD-10-CM | POA: Diagnosis not present

## 2023-08-07 DIAGNOSIS — I5032 Chronic diastolic (congestive) heart failure: Secondary | ICD-10-CM | POA: Diagnosis not present

## 2023-08-07 DIAGNOSIS — H409 Unspecified glaucoma: Secondary | ICD-10-CM | POA: Diagnosis not present

## 2023-08-07 DIAGNOSIS — I110 Hypertensive heart disease with heart failure: Secondary | ICD-10-CM | POA: Diagnosis not present

## 2023-08-07 DIAGNOSIS — J9811 Atelectasis: Secondary | ICD-10-CM | POA: Diagnosis not present

## 2023-08-07 DIAGNOSIS — Z79899 Other long term (current) drug therapy: Secondary | ICD-10-CM | POA: Diagnosis not present

## 2023-08-07 DIAGNOSIS — Z7982 Long term (current) use of aspirin: Secondary | ICD-10-CM | POA: Diagnosis not present

## 2023-08-07 DIAGNOSIS — Z9181 History of falling: Secondary | ICD-10-CM | POA: Diagnosis not present

## 2023-08-07 DIAGNOSIS — S93402D Sprain of unspecified ligament of left ankle, subsequent encounter: Secondary | ICD-10-CM | POA: Diagnosis not present

## 2023-08-07 DIAGNOSIS — G4733 Obstructive sleep apnea (adult) (pediatric): Secondary | ICD-10-CM | POA: Diagnosis not present

## 2023-08-07 DIAGNOSIS — I890 Lymphedema, not elsewhere classified: Secondary | ICD-10-CM | POA: Diagnosis not present

## 2023-08-11 DIAGNOSIS — I890 Lymphedema, not elsewhere classified: Secondary | ICD-10-CM | POA: Diagnosis not present

## 2023-08-11 DIAGNOSIS — S93402D Sprain of unspecified ligament of left ankle, subsequent encounter: Secondary | ICD-10-CM | POA: Diagnosis not present

## 2023-08-11 DIAGNOSIS — H409 Unspecified glaucoma: Secondary | ICD-10-CM | POA: Diagnosis not present

## 2023-08-11 DIAGNOSIS — J9811 Atelectasis: Secondary | ICD-10-CM | POA: Diagnosis not present

## 2023-08-11 DIAGNOSIS — Z7982 Long term (current) use of aspirin: Secondary | ICD-10-CM | POA: Diagnosis not present

## 2023-08-11 DIAGNOSIS — Z79899 Other long term (current) drug therapy: Secondary | ICD-10-CM | POA: Diagnosis not present

## 2023-08-11 DIAGNOSIS — Z9181 History of falling: Secondary | ICD-10-CM | POA: Diagnosis not present

## 2023-08-11 DIAGNOSIS — I5032 Chronic diastolic (congestive) heart failure: Secondary | ICD-10-CM | POA: Diagnosis not present

## 2023-08-11 DIAGNOSIS — G4733 Obstructive sleep apnea (adult) (pediatric): Secondary | ICD-10-CM | POA: Diagnosis not present

## 2023-08-11 DIAGNOSIS — I110 Hypertensive heart disease with heart failure: Secondary | ICD-10-CM | POA: Diagnosis not present

## 2023-08-14 DIAGNOSIS — I890 Lymphedema, not elsewhere classified: Secondary | ICD-10-CM | POA: Diagnosis not present

## 2023-08-14 DIAGNOSIS — I5032 Chronic diastolic (congestive) heart failure: Secondary | ICD-10-CM | POA: Diagnosis not present

## 2023-08-14 DIAGNOSIS — J9811 Atelectasis: Secondary | ICD-10-CM | POA: Diagnosis not present

## 2023-08-14 DIAGNOSIS — Z7982 Long term (current) use of aspirin: Secondary | ICD-10-CM | POA: Diagnosis not present

## 2023-08-14 DIAGNOSIS — S93402D Sprain of unspecified ligament of left ankle, subsequent encounter: Secondary | ICD-10-CM | POA: Diagnosis not present

## 2023-08-14 DIAGNOSIS — H409 Unspecified glaucoma: Secondary | ICD-10-CM | POA: Diagnosis not present

## 2023-08-14 DIAGNOSIS — Z9181 History of falling: Secondary | ICD-10-CM | POA: Diagnosis not present

## 2023-08-14 DIAGNOSIS — Z79899 Other long term (current) drug therapy: Secondary | ICD-10-CM | POA: Diagnosis not present

## 2023-08-14 DIAGNOSIS — I110 Hypertensive heart disease with heart failure: Secondary | ICD-10-CM | POA: Diagnosis not present

## 2023-08-14 DIAGNOSIS — G4733 Obstructive sleep apnea (adult) (pediatric): Secondary | ICD-10-CM | POA: Diagnosis not present

## 2023-09-20 DIAGNOSIS — R5381 Other malaise: Secondary | ICD-10-CM | POA: Diagnosis not present

## 2023-09-20 DIAGNOSIS — R627 Adult failure to thrive: Secondary | ICD-10-CM | POA: Diagnosis not present

## 2023-09-22 ENCOUNTER — Emergency Department
Admission: EM | Admit: 2023-09-22 | Discharge: 2023-09-27 | Disposition: A | Discharge status: Skilled Nursing Facility | Attending: Emergency Medicine | Admitting: Emergency Medicine

## 2023-09-22 ENCOUNTER — Emergency Department

## 2023-09-22 ENCOUNTER — Encounter: Payer: Self-pay | Admitting: Emergency Medicine

## 2023-09-22 ENCOUNTER — Other Ambulatory Visit: Payer: Self-pay

## 2023-09-22 DIAGNOSIS — I110 Hypertensive heart disease with heart failure: Secondary | ICD-10-CM | POA: Insufficient documentation

## 2023-09-22 DIAGNOSIS — R296 Repeated falls: Secondary | ICD-10-CM | POA: Insufficient documentation

## 2023-09-22 DIAGNOSIS — R29898 Other symptoms and signs involving the musculoskeletal system: Secondary | ICD-10-CM | POA: Insufficient documentation

## 2023-09-22 DIAGNOSIS — I509 Heart failure, unspecified: Secondary | ICD-10-CM | POA: Diagnosis not present

## 2023-09-22 DIAGNOSIS — R0602 Shortness of breath: Secondary | ICD-10-CM | POA: Diagnosis not present

## 2023-09-22 DIAGNOSIS — R627 Adult failure to thrive: Secondary | ICD-10-CM | POA: Diagnosis not present

## 2023-09-22 DIAGNOSIS — W19XXXA Unspecified fall, initial encounter: Secondary | ICD-10-CM | POA: Diagnosis not present

## 2023-09-22 DIAGNOSIS — R0989 Other specified symptoms and signs involving the circulatory and respiratory systems: Secondary | ICD-10-CM | POA: Diagnosis not present

## 2023-09-22 DIAGNOSIS — R531 Weakness: Secondary | ICD-10-CM | POA: Diagnosis present

## 2023-09-22 DIAGNOSIS — R5381 Other malaise: Secondary | ICD-10-CM

## 2023-09-22 LAB — CBC WITH DIFFERENTIAL/PLATELET
Abs Immature Granulocytes: 0.04 10*3/uL (ref 0.00–0.07)
Basophils Absolute: 0 10*3/uL (ref 0.0–0.1)
Basophils Relative: 1 %
Eosinophils Absolute: 0.3 10*3/uL (ref 0.0–0.5)
Eosinophils Relative: 4 %
HCT: 37.8 % (ref 36.0–46.0)
Hemoglobin: 11.7 g/dL — ABNORMAL LOW (ref 12.0–15.0)
Immature Granulocytes: 1 %
Lymphocytes Relative: 29 %
Lymphs Abs: 2.1 10*3/uL (ref 0.7–4.0)
MCH: 28.5 pg (ref 26.0–34.0)
MCHC: 31 g/dL (ref 30.0–36.0)
MCV: 92 fL (ref 80.0–100.0)
Monocytes Absolute: 0.8 10*3/uL (ref 0.1–1.0)
Monocytes Relative: 10 %
Neutro Abs: 4 10*3/uL (ref 1.7–7.7)
Neutrophils Relative %: 55 %
Platelets: 267 10*3/uL (ref 150–400)
RBC: 4.11 MIL/uL (ref 3.87–5.11)
RDW: 16 % — ABNORMAL HIGH (ref 11.5–15.5)
WBC: 7.3 10*3/uL (ref 4.0–10.5)
nRBC: 0 % (ref 0.0–0.2)

## 2023-09-22 LAB — URINALYSIS, ROUTINE W REFLEX MICROSCOPIC
Bilirubin Urine: NEGATIVE
Glucose, UA: NEGATIVE mg/dL
Hgb urine dipstick: NEGATIVE
Ketones, ur: NEGATIVE mg/dL
Nitrite: NEGATIVE
Protein, ur: NEGATIVE mg/dL
Specific Gravity, Urine: 1.017 (ref 1.005–1.030)
pH: 6 (ref 5.0–8.0)

## 2023-09-22 LAB — MAGNESIUM: Magnesium: 1.5 mg/dL — ABNORMAL LOW (ref 1.7–2.4)

## 2023-09-22 LAB — COMPREHENSIVE METABOLIC PANEL WITH GFR
ALT: 12 U/L (ref 0–44)
AST: 17 U/L (ref 15–41)
Albumin: 3.5 g/dL (ref 3.5–5.0)
Alkaline Phosphatase: 42 U/L (ref 38–126)
Anion gap: 12 (ref 5–15)
BUN: 13 mg/dL (ref 8–23)
CO2: 28 mmol/L (ref 22–32)
Calcium: 9.1 mg/dL (ref 8.9–10.3)
Chloride: 100 mmol/L (ref 98–111)
Creatinine, Ser: 0.75 mg/dL (ref 0.44–1.00)
GFR, Estimated: >60 mL/min (ref >60)
Glucose, Bld: 116 mg/dL — ABNORMAL HIGH (ref 70–99)
Potassium: 3.9 mmol/L (ref 3.5–5.1)
Sodium: 140 mmol/L (ref 135–145)
Total Bilirubin: 0.6 mg/dL (ref 0.0–1.2)
Total Protein: 6.9 g/dL (ref 6.5–8.1)

## 2023-09-22 LAB — CK: Total CK: 154 U/L (ref 38–234)

## 2023-09-22 LAB — BRAIN NATRIURETIC PEPTIDE: B Natriuretic Peptide: 27.6 pg/mL (ref 0.0–100.0)

## 2023-09-22 LAB — TROPONIN I (HIGH SENSITIVITY): Troponin I (High Sensitivity): 9 ng/L (ref <18)

## 2023-09-22 MED ORDER — MAGNESIUM OXIDE -MG SUPPLEMENT 400 (240 MG) MG PO TABS
400.0000 mg | ORAL_TABLET | Freq: Once | ORAL | Status: AC
  Administered 2023-09-22: 400 mg via ORAL
  Filled 2023-09-22: qty 1

## 2023-09-22 NOTE — ED Notes (Signed)
 Pt was able to stand and pivot slowly from stretcher to hospital bed. Pt pulled up in bed by this rn and another rn. Purewick was placed back on pt. Pt has no other needs at this time.   High fall risk bundle implemented.

## 2023-09-22 NOTE — ED Notes (Signed)
 Went to check on pt to see if she is feeling any better, pt advised, " I am feeling a lot better, thank you for taking care of me."

## 2023-09-22 NOTE — ED Notes (Addendum)
 Answered call light, pt stated she is wet, assisted RN, Kim with changing the pt bed linens, chuck pads and putting in a new purewick

## 2023-09-22 NOTE — ED Provider Notes (Signed)
 [facility] Provider Note    Event Date/Time   First MD Initiated Contact with Patient 09/22/23 1017     (approximate)   History   Fall   HPI  Angie Webb is a 73 y.o. female history of chronic hep C, anxiety, CHF, osteoarthritis, hypertension presents emergency department after multiple falls and weakness.  Patient states nothing hurts at this time.  Last time she fell she was placed at [facility] and had physical therapy coming to the house.  However physical therapy has not had her walk to the kitchen or anything.  She has been bedbound for about 2 months.  She did go to rehab at [facility] rehab facility but states it was so dirty that she left prior to being discharged.  Her PCP instructed her due to her inability to walk and care for herself she should come the emergency department.  States she does have some swelling in the lower legs.  Unsure if she is more short of breath than normal.      Physical Exam   Triage Vital Signs: ED Triage Vitals [09/22/23 1009]  Encounter Vitals Group     BP (!) 117/101     Systolic BP Percentile      Diastolic BP Percentile      Pulse Rate 80     Resp 20     Temp 97.8 F (36.6 C)     Temp Source Oral     SpO2 99 %     Weight (!) 374 lb 12.5 oz (170 kg)     Height 5' 3 (1.6 m)     Head Circumference      Peak Flow      Pain Score 0     Pain Loc      Pain Education      Exclude from Growth Chart     Most recent vital signs: Vitals:   09/22/23 1009  BP: (!) 117/101  Pulse: 80  Resp: 20  Temp: 97.8 F (36.6 C)  SpO2: 99%     General: Awake, no distress.   CV:  Good peripheral perfusion. regular rate and  rhythm Resp:  Normal effort. Lungs CTA Abd:  No distention.  Nontender Other:  Legs are swollen bilaterally, neurovascular intact   ED Results / Procedures / Treatments   Labs (all labs ordered are listed, but only abnormal results are displayed) Labs Reviewed  COMPREHENSIVE METABOLIC PANEL  WITH GFR - Abnormal; Notable for the following components:      Result Value   Glucose, Bld 116 (*)    All other components within normal limits  CBC WITH DIFFERENTIAL/PLATELET - Abnormal; Notable for the following components:   Hemoglobin 11.7 (*)    RDW 16.0 (*)    All other components within normal limits  URINALYSIS, ROUTINE W REFLEX MICROSCOPIC - Abnormal; Notable for the following components:   Color, Urine YELLOW (*)    APPearance HAZY (*)    Leukocytes,Ua TRACE (*)    Bacteria, UA MANY (*)    All other components within normal limits  MAGNESIUM  - Abnormal; Notable for the following components:   Magnesium  1.5 (*)    All other components within normal limits  BRAIN NATRIURETIC PEPTIDE  CK  TROPONIN I (HIGH SENSITIVITY)     EKG  EKG   RADIOLOGY Chest x-ray    PROCEDURES:   Procedures Chief Complaint  Patient presents with   Fall      MEDICATIONS ORDERED  IN ED: Medications - No data to display   IMPRESSION / MDM / ASSESSMENT AND PLAN / ED COURSE  I reviewed the triage vital signs and the nursing notes.                              Differential diagnosis includes, but is not limited to, weakness, CHF, rhabdomyolysis, failure to thrive secondary to immobility  Patient's presentation is most consistent with acute illness / injury with system symptoms.   Labs and imaging ordered, EKG ordered   Labs are reassuring, EKG shows normal sinus rhythm, see physician right Chest x-ray independently reviewed interpreted by me as being negative for any acute abnormality  Dr. Levander in the see the patient.  Will try for an admission.  Dr. Sherre denied admission as no real medical reason to admit.  Patient will be a border until she can be sent to a facility  Will await pharmacy tech med reconciliation prior to ordering medications   FINAL CLINICAL IMPRESSION(S) / ED DIAGNOSES   Final diagnoses:  Failure to thrive in adult  Weakness of both legs  Multiple  falls     Rx / DC Orders   ED Discharge Orders     None        Note:  This document was prepared using Dragon voice recognition software and may include unintentional dictation errors.    Gasper Devere ORN, PA-C 09/22/23 1436    Levander Slate, MD 09/22/23 [phone]

## 2023-09-22 NOTE — ED Notes (Signed)
 Pt able to sit on side of bed w/ assistance but unable to stand, DOE noted. Pt placed on bedpan at this time.

## 2023-09-22 NOTE — ED Notes (Addendum)
 Trash from dinner removed

## 2023-09-22 NOTE — ED Notes (Addendum)
 In error

## 2023-09-22 NOTE — ED Triage Notes (Addendum)
 Presents via EMS from home  States she fell 2 months ago from a seated position  Was admitted to hospital  Placed in rehab  Then discharge home with PT  States PT never got up  She has been bed bound since   Per EMS she has able to stand  States her PCP tole her to come here for rehab placement

## 2023-09-22 NOTE — ED Notes (Signed)
 PT put on bedpan but pt did not have a BM, just gas. Peri care provided and new purewick placed. Pt pulled up in bed. This RN was assisted by NT, Gabby.

## 2023-09-22 NOTE — Consult Note (Signed)
 Initial Consultation Note  Vitals in the ED showed temperature of 97.8, respiration rate 20, heart rate 80, blood pressure 117/101, SpO2 99% on room air.  Serum sodium 140, potassium 3.9, chloride 100, bicarb 28, BUN of 13, serum creatinine 0.75, EGFR greater than 60, nonfasting blood glucose 116, WBC 7.7, hemoglobin 11.7, platelets of 263.  UA was positive for trace leukocytes and negative for nitrates.  Chest x-ray was unremarkable for acute cardiopulmonary process.  EKG was done which showed sinus rhythm with rate of 79, QTc 426.  Chart reviewed: Patient received telemedicine visit with [facility] provider stating that patient wanted to go back to the hospital because she has been bedbound for about 2 months.  She has not been able to ambulate well.    Of note: Patient was admitted at [facility] from 07/10/23-07/14/23 for fall with left ankle pain.  Patient was discharged to rehab however did not complete care there due to the condition of the facility.  # Debility-unfortunately patient does not meet criteria for inpatient hospitalization.  Would recommend PT, OT consult working in conjunction with TOC so that patient can find a facility for her to go to for short-term nursing rehab.  Would also advised that patient/family call [facility]-term/nursing home with assisted living, as patient may need this after completion of rehab therapy.  Going back to her home living on the seventh floor with elevator may be too strenuous for her without assisted living.  # Trace leukocytes, present at the time of ED evaluation  Her urine analysis frequently has trace leukocytes.  And in the absence of urinary symptoms and other signs of infection, would advise against antibiotic including p.o. at this time as antibiotics are not benign.  Dr.Keona Sheffler

## 2023-09-22 NOTE — ED Notes (Addendum)
 Answered call light, pt stated she is having a hard time breathing, assessed pt VS PO shows 100%.  Pt stated she feels hot, gave pt a fan and set the head of her bed a little more.

## 2023-09-22 NOTE — ED Notes (Signed)
 Purewick remains, suction verified under 80 mmHg.

## 2023-09-22 NOTE — Evaluation (Signed)
 Physical Therapy Evaluation Patient Details Name: Angie Webb MRN: [id number] DOB: 1950/07/24 Today's Date: 09/22/2023  History of Present Illness  Pt is a 73 y.o. female presenting to hospital 09/22/23 with c/o multiple falls and weakness; bedbound for about 2 months.  Clinical Impression  Pt reports being modified independent ambulating with rollator in February 2025 and driving but has been bedbound for last 2 months since fall in February (was receiving HHPT 3x/week but stopped about 1 month ago; pt reports she has been doing ex's, sitting on EOB, and standing every day on own using walker but otherwise bed-bound d/t weakness).  Pt reports a fall last Wednesday trying to take steps on own.  Pt lives alone in 7th floor apt (elevator access); 2 neighbors assist daily and as needed (pt able to do a lot bed level with set-up assist but wants to get back to walking again like she was in February).  Currently pt is SBA to min assist with bed mobility and SBA sitting balance; ED stretcher bed height was too tall (at lowest height) for pt to safely attempt standing.  Pt would currently benefit from skilled PT to address noted impairments and functional limitations (see below for any additional details).  Upon hospital discharge, pt would benefit from ongoing therapy (pt appearing very motivated to get stronger and be able to walk again).     If plan is discharge home, recommend the following: Two people to help with walking and/or transfers;A lot of help with bathing/dressing/bathroom;Assistance with cooking/housework;Assist for transportation;Help with stairs or ramp for entrance   Can travel by private vehicle   No    Equipment Recommendations Other (comment) (TBD at next facility)  Recommendations for Other Services       Functional Status Assessment Patient has had a recent decline in their functional status and demonstrates the ability to make significant improvements in function in a  reasonable and predictable amount of time.     Precautions / Restrictions Precautions Precautions: Fall Restrictions Weight Bearing Restrictions Per Provider Order: No      Mobility  Bed Mobility Overal bed mobility: Needs Assistance Bed Mobility: Supine to Sit, Sit to Supine, Rolling Rolling: Min assist (logrolling to L for NT to place bed pan)   Supine to sit: Supervision, HOB elevated, Used rails Sit to supine: Min assist, HOB elevated, Used rails (assist for B LE's)   General bed mobility comments: increased effort to perform as much as possible on own    Transfers                   General transfer comment: Deferred (ED stretcher bed too high at lowest height for pt to safely stand from)    Ambulation/Gait                  Stairs            Wheelchair Mobility     Tilt Bed    Modified Rankin (Stroke Patients Only)       Balance Overall balance assessment: Needs assistance Sitting-balance support: No upper extremity supported, Feet unsupported Sitting balance-Leahy Scale: Good Sitting balance - Comments: steady reaching within BOS                                     Pertinent Vitals/Pain Pain Assessment Pain Assessment: No/denies pain HR 80 bpm with SpO2 sats 100% on room air  post activity; SOB noted with activity.    Home Living Family/patient expects to be discharged to:: Private residence Living Arrangements: Alone Available Help at Discharge: Neighbor Type of Home: Apartment (7th floor) Home Access: Elevator       Home Layout: One level Home Equipment: [employer]ant (2 wheels);Rollator (4 wheels);BSC/3in1;Shower seat;Wheelchair - manual;Grab bars - tub/shower;Grab bars - toilet      Prior Function Prior Level of Function : Needs assist             Mobility Comments: Pt reports being modified independent ambulating with rollator in February and driving but has been bedbound for last 2 months since  fall in February (was receiving HHPT 3x/week but stopped about 1 month ago;  pt reports she has been doing ex's, sitting on EOB, and standing every day on own using walker but otherwise bedbound).  Fall last Wednesday trying to take steps on own. ADLs Comments: Pt uses pads or bed pan for toileting in bed.  2 ladies down the hall (neighbors) assist pt as needed (brings water for bathing--pt sponge baths on own in bed; brings breakfast, lunch, and dinner).     Extremity/Trunk Assessment   Upper Extremity Assessment Upper Extremity Assessment: Generalized weakness    Lower Extremity Assessment Lower Extremity Assessment: Generalized weakness       Communication   Communication Communication: No apparent difficulties    Cognition Arousal: Alert Behavior During Therapy: WFL for tasks assessed/performed   PT - Cognitive impairments: No apparent impairments                         Following commands: Intact       Cueing Cueing Techniques: Verbal cues     General Comments      Exercises     Assessment/Plan    PT Assessment Patient needs continued PT services  PT Problem List Decreased strength;Decreased activity tolerance;Decreased balance;Decreased mobility;Obesity       PT Treatment Interventions DME instruction;Gait training;Functional mobility training;Therapeutic activities;Therapeutic exercise;Balance training;Patient/family education    PT Goals (Current goals can be found in the Care Plan section)  Acute Rehab PT Goals Patient Stated Goal: to be able to walk again PT Goal Formulation: With patient Time For Goal Achievement: 10/06/23 Potential to Achieve Goals: Fair    Frequency Min 2X/week     Co-evaluation               AM-PAC PT 6 Clicks Mobility  Outcome Measure Help needed turning from your back to your side while in a flat bed without using bedrails?: A Little Help needed moving from lying on your back to sitting on the side of a  flat bed without using bedrails?: A Little Help needed moving to and from a bed to a chair (including a wheelchair)?: Total Help needed standing up from a chair using your arms (e.g., wheelchair or bedside chair)?: Total Help needed to walk in hospital room?: Total Help needed climbing 3-5 steps with a railing? : Total 6 Click Score: 10    End of Session   Activity Tolerance: Patient limited by fatigue Patient left: in bed;with call bell/phone within reach;Other (comment) (pt on bed pan--NT aware and waiting for pt to press call light when finished) Nurse Communication: Mobility status;Precautions PT Visit Diagnosis: Other abnormalities of gait and mobility (R26.89);Muscle weakness (generalized) (M62.81);History of falling (Z91.81)    Time: 8368-8344 PT Time Calculation (min) (ACUTE ONLY): 24 min   Charges:  PT Evaluation $PT Eval Low Complexity: 1 Low PT Treatments $Therapeutic Activity: 8-22 mins PT General Charges $$ ACUTE PT VISIT: 1 Visit        Damien Caulk, PT 09/22/23, 5:44 PM

## 2023-09-22 NOTE — ED Notes (Signed)
 Pt cleaned of urine, purewick repositioned

## 2023-09-22 NOTE — ED Notes (Signed)
 Pt's purewick did not work correctly and urine soiled pt's sheets. Pt's sheets changed, new purewick placed.

## 2023-09-22 NOTE — ED Notes (Signed)
 Pt cleaned of stool and bedpan removed. Pt changed into hospital gown and repositioned in bed.

## 2023-09-23 DIAGNOSIS — R0602 Shortness of breath: Secondary | ICD-10-CM | POA: Diagnosis not present

## 2023-09-23 MED ORDER — FERROUS SULFATE 325 (65 FE) MG PO TABS
325.0000 mg | ORAL_TABLET | Freq: Three times a day (TID) | ORAL | Status: DC
  Administered 2023-09-23 – 2023-09-27 (×11): 325 mg via ORAL
  Filled 2023-09-23 (×11): qty 1

## 2023-09-23 MED ORDER — ACETAMINOPHEN 500 MG PO TABS
1000.0000 mg | ORAL_TABLET | Freq: Four times a day (QID) | ORAL | Status: DC | PRN
  Administered 2023-09-23 (×2): 1000 mg via ORAL
  Filled 2023-09-23 (×2): qty 2

## 2023-09-23 MED ORDER — FLUTICASONE PROPIONATE 50 MCG/ACT NA SUSP
2.0000 | Freq: Every day | NASAL | Status: DC
Start: 2023-09-23 — End: 2023-09-27
  Administered 2023-09-23 – 2023-09-27 (×5): 2 via NASAL
  Filled 2023-09-23 (×2): qty 16

## 2023-09-23 MED ORDER — DORZOLAMIDE HCL-TIMOLOL MAL 2-0.5 % OP SOLN
1.0000 [drp] | Freq: Two times a day (BID) | OPHTHALMIC | Status: DC
  Administered 2023-09-23 – 2023-09-27 (×9): 1 [drp] via OPHTHALMIC
  Filled 2023-09-23 (×2): qty 10

## 2023-09-23 MED ORDER — LABETALOL HCL 100 MG PO TABS
100.0000 mg | ORAL_TABLET | Freq: Two times a day (BID) | ORAL | Status: DC
  Administered 2023-09-23 – 2023-09-27 (×9): 100 mg via ORAL
  Filled 2023-09-23 (×9): qty 1

## 2023-09-23 MED ORDER — LISINOPRIL 10 MG PO TABS
20.0000 mg | ORAL_TABLET | Freq: Two times a day (BID) | ORAL | Status: DC
  Administered 2023-09-23 – 2023-09-27 (×9): 20 mg via ORAL
  Filled 2023-09-23 (×9): qty 2

## 2023-09-23 MED ORDER — HYDROCHLOROTHIAZIDE 25 MG PO TABS
25.0000 mg | ORAL_TABLET | Freq: Every day | ORAL | Status: DC
  Administered 2023-09-23 – 2023-09-27 (×5): 25 mg via ORAL
  Filled 2023-09-23 (×5): qty 1

## 2023-09-23 MED ORDER — LORATADINE 10 MG PO TABS
10.0000 mg | ORAL_TABLET | Freq: Every day | ORAL | Status: DC
  Administered 2023-09-23 – 2023-09-27 (×5): 10 mg via ORAL
  Filled 2023-09-23 (×5): qty 1

## 2023-09-23 MED ORDER — CLONIDINE HCL 0.1 MG PO TABS
0.2000 mg | ORAL_TABLET | Freq: Two times a day (BID) | ORAL | Status: DC
  Administered 2023-09-23 – 2023-09-27 (×8): 0.2 mg via ORAL
  Filled 2023-09-23 (×9): qty 2

## 2023-09-23 MED ORDER — PANTOPRAZOLE SODIUM 40 MG PO TBEC
40.0000 mg | DELAYED_RELEASE_TABLET | Freq: Every day | ORAL | Status: DC
  Administered 2023-09-23 – 2023-09-27 (×5): 40 mg via ORAL
  Filled 2023-09-23 (×5): qty 1

## 2023-09-23 NOTE — ED Notes (Signed)
 Barrier cream applied to pt inner thighs per her request.

## 2023-09-23 NOTE — ED Provider Notes (Signed)
-----------------------------------------   4:24 AM on 09/23/2023 -----------------------------------------   Blood pressure (!) 163/86, pulse 88, temperature 98.2 F (36.8 C), temperature source Oral, resp. rate 18, height 1.6 m (5\' 3" ), weight (!) 170 kg, SpO2 100%.  The patient is calm and cooperative at this time.  There have been no acute events since the last update.  Awaiting disposition plan from [facility] team.   Lynnda Sas, MD 09/23/23 [phone]

## 2023-09-23 NOTE — [place] FL2 (Signed)
 [place]  MEDICAID FL2 LEVEL OF CARE FORM     IDENTIFICATION  Patient Name: Angie Webb Birthdate: 10-02-1950 Sex: female Admission Date (Current Location): 09/22/2023  [facility] and [place] Number:  Chiropodist and Address:  [facility], [address] [place], [place] [address]      Provider Number: [id number]  Attending Physician Name and Address:  No att. providers found  Relative Name and Phone Number:  Candelaria Chaco (Sister)  [phone] [facility] Phone)    Current Level of Care: Hospital Recommended Level of Care: Skilled Nursing Facility Prior Approval Number:    Date Approved/Denied:   PASRR Number: [id number]  Discharge Plan:      Current Diagnoses: Patient Active Problem List   Diagnosis Date Noted   Aortic atherosclerosis (HCC) 03/06/2019   Urinary incontinence in female 09/13/2016   Hypochromic erythrocytes 10/15/2015   Impaired fasting glucose 10/15/2015   Abnormal weight gain 09/30/2015   Medication monitoring encounter 09/30/2015   Weakness of both lower extremities 03/03/2015   Hx of hepatitis C 02/18/2015   Anxiety and depression 02/18/2015   Glaucoma 02/18/2015   Hypertension goal BP (blood pressure) < 140/90 02/18/2015   Morbid obesity with BMI of 50.0-59.9, adult (HCC) 02/18/2015   Obstructive sleep apnea of adult 02/18/2015   Chronic post-traumatic stress disorder 02/18/2015   Other bilateral secondary osteoarthritis of knee 02/18/2015   Allergic rhinitis 10/30/2012   Acid reflux 10/30/2012   Idiopathic localized osteoarthropathy 01/16/2012   Chronic systolic congestive heart failure, NYHA class 2 (HCC) 04/05/2011   Clinical depression 02/27/2009    Orientation RESPIRATION BLADDER Height & Weight     Self, Time, Situation, Place  Normal Continent Weight: (!) 374 lb 12.5 oz (170 kg) Height:  5\' 3"  (160 cm)  BEHAVIORAL SYMPTOMS/MOOD NEUROLOGICAL BOWEL NUTRITION STATUS      Continent Diet (reg)   AMBULATORY STATUS COMMUNICATION OF NEEDS Skin   Extensive Assist Verbally                         Personal Care Assistance Level of Assistance  Bathing, Feeding, Dressing Bathing Assistance: Maximum assistance Feeding assistance: Limited assistance Dressing Assistance: Maximum assistance     Functional Limitations Info             SPECIAL CARE FACTORS FREQUENCY  PT (By licensed PT), OT (By licensed OT)     PT Frequency: 5 times per week OT Frequency: 5 times per week            Contractures      Additional Factors Info  Code Status, Allergies   Allergies Info: Allergies: Other, Bee Venom           Current Medications (09/23/2023):  This is the current hospital active medication list Current Facility-Administered Medications  Medication Dose Route Frequency Provider Last Rate Last Admin   acetaminophen  (TYLENOL ) tablet 1,000 mg  1,000 mg Oral Q6H PRN Paduchowski, Kevin, MD       cloNIDine  (CATAPRES ) tablet 0.2 mg  0.2 mg Oral BID Paduchowski, Kevin, MD       dorzolamide -timolol  (COSOPT ) 2-0.5 % ophthalmic solution 1 drop  1 drop Both Eyes BID Ruth Cove, MD       ferrous sulfate  EC tablet 325 mg  325 mg Oral TID WC Paduchowski, Kevin, MD       fluticasone  (FLONASE ) 50 MCG/ACT nasal spray 2 spray  2 spray Each Nare Daily Ruth Cove, MD  hydrochlorothiazide  (HYDRODIURIL ) tablet 25 mg  25 mg Oral Daily Paduchowski, Kevin, MD       labetalol  (NORMODYNE ) tablet 100 mg  100 mg Oral BID Ruth Cove, MD       lisinopril  (ZESTRIL ) tablet 20 mg  20 mg Oral BID Ruth Cove, MD       loratadine  (CLARITIN  REDITABS) dissolvable tablet 10 mg  10 mg Oral Daily Paduchowski, Kevin, MD       pantoprazole  (PROTONIX ) EC tablet 40 mg  40 mg Oral Daily Paduchowski, Kevin, MD       Current Outpatient Medications  Medication Sig Dispense Refill   acetaminophen  (TYLENOL ) 500 MG tablet Take 1,000 mg by mouth every 6 (six) hours as needed for mild  pain (pain score 1-3) or moderate pain (pain score 4-6).     aspirin EC 81 MG tablet Take 81 mg by mouth.     cloNIDine  (CATAPRES ) 0.2 MG tablet Take 0.2 mg by mouth 2 (two) times daily.     diclofenac sodium (VOLTAREN) 1 % GEL Apply topically.     dorzolamide -timolol  (COSOPT ) 2-0.5 % ophthalmic solution Place 1 drop into both eyes 2 (two) times daily.     ferrous sulfate  325 (65 FE) MG EC tablet Take 325 mg by mouth 3 (three) times daily with meals.     fluticasone  (FLONASE ) 50 MCG/ACT nasal spray Place 2 sprays into both nostrils daily. 16 g 5   hydrochlorothiazide  (HYDRODIURIL ) 25 MG tablet Take 25 mg by mouth daily.     labetalol  (NORMODYNE ) 100 MG tablet Take 1 tablet (100 mg total) by mouth 2 (two) times daily. 60 tablet 0   latanoprost (XALATAN) 0.005 % ophthalmic solution Apply to eye.     lisinopril  (PRINIVIL ,ZESTRIL ) 20 MG tablet Take 20 mg by mouth 2 (two) times daily.     loratadine  (CLARITIN  REDITABS) 10 MG dissolvable tablet Take 10 mg by mouth daily.     nystatin  cream (MYCOSTATIN ) Apply topically 2 (two) times daily as needed for dry skin. To affected area(s) 30 g 1   omeprazole  (PRILOSEC) 20 MG capsule Take 1 capsule (20 mg total) by mouth daily. 30 capsule 0   torsemide (DEMADEX) 20 MG tablet 20 mg daily.      Misc. Devices [facility]) MISC 1 Device by Does not apply route daily. 1 each 0   Saccharomyces boulardii (PROBIOTIC) 250 MG CAPS Take 1 capsule by mouth in the morning and at bedtime. (Patient not taking: Reported on 09/22/2023) 30 capsule 0   timolol  (TIMOPTIC ) 0.5 % ophthalmic solution  (Patient not taking: Reported on 09/22/2023)       Discharge Medications: Please see discharge summary for a list of discharge medications.  Relevant Imaging Results:  Relevant Lab Results:   Additional Information SS #: [id number]  Ladon Vandenberghe E Alfie Alderfer, LCSW

## 2023-09-23 NOTE — ED Notes (Signed)
 ED provider messaged and asked to order home medications for pt.

## 2023-09-23 NOTE — TOC Initial Note (Addendum)
 Transition of Care [facility]) - Initial/Assessment Note    Patient Details  Name: Angie Webb MRN: [id number] Date of Birth: 11-15-1950  [facility] of [facility]) CM/SW Contact:    Zyeir Dymek E Tyteanna Ost, LCSW Phone Number: 09/23/2023, 10:46 AM  Clinical Narrative:                 CSW met with patient at bedside in the ED. Patient lives alone. Her neighbors provide assistance. Patient states at her baseline she was driving herself to appointments, but has not been able to drive since February. PCP is Dr. Lindsay [facility] at [facility]. [facility]. Patient has a rolling walker, rollator, 3in1, wheelchair, grab bars, and shower chair. Patient had [facility] recently. Patient is agreeable to SNF - denies SNF history. She states her first choice is [facility], and she does not want the referral sent to [facility].    Expected Discharge Plan: Skilled Nursing Facility Barriers to Discharge: Continued Medical Work up   Patient Goals and CMS Choice   CMS Medicare.gov Compare Post Acute Care list provided to:: Patient Choice offered to / list presented to : Patient      Expected Discharge Plan and Services       Living arrangements for the past 2 months: Apartment                                      Prior Living Arrangements/Services Living arrangements for the past 2 months: Apartment Lives with:: Self Patient language and need for interpreter reviewed:: Yes Do you feel safe going back to the place where you live?: Yes      Need for Family Participation in Patient Care: Yes (Comment) Care giver support system in place?: Yes (comment) Current home services: DME Criminal Activity/Legal Involvement Pertinent to Current Situation/Hospitalization: No - Comment as needed  Activities of Daily Living      Permission Sought/Granted Permission sought to share information with : Facility Industrial/product designer granted to share information with :  Yes, Verbal Permission Granted     Permission granted to share info w AGENCY: SNFs        Emotional Assessment       Orientation: : Oriented to Self, Oriented to Place, Oriented to  Time, Oriented to Situation Alcohol / Substance Use: Not Applicable Psych Involvement: No (comment)  Admission diagnosis:  Fall Patient Active Problem List   Diagnosis Date Noted   Aortic atherosclerosis (HCC) 03/06/2019   Urinary incontinence in female 09/13/2016   Hypochromic erythrocytes 10/15/2015   Impaired fasting glucose 10/15/2015   Abnormal weight gain 09/30/2015   Medication monitoring encounter 09/30/2015   Weakness of both lower extremities 03/03/2015   Hx of hepatitis C 02/18/2015   Anxiety and depression 02/18/2015   Glaucoma 02/18/2015   Hypertension goal BP (blood pressure) < 140/90 02/18/2015   Morbid obesity with BMI of 50.0-59.9, adult (HCC) 02/18/2015   Obstructive sleep apnea of adult 02/18/2015   Chronic post-traumatic stress disorder 02/18/2015   Other bilateral secondary osteoarthritis of knee 02/18/2015   Allergic rhinitis 10/30/2012   Acid reflux 10/30/2012   Idiopathic localized osteoarthropathy 01/16/2012   Chronic systolic congestive heart failure, NYHA class 2 (HCC) 04/05/2011   Clinical depression 02/27/2009   PCP:  [facility]   [id number] [place] [place] - [address] [place] [address] Phone: [phone]  Fax: [phone]     Social Drivers of Health (SDOH) Social History: SDOH Screenings   Food Insecurity: No Food Insecurity (11/11/2022)   Received from [facility]  Transportation Needs: No Transportation Needs (11/11/2022)   Received from [facility]  Utilities: Low Risk  (11/11/2022)   Received from [facility]  Financial Resource Strain: Low Risk  (11/11/2022)   Received from [facility]  Tobacco Use: Medium Risk (09/22/2023)  Health Literacy: Low Risk  (12/31/2021)   Received from [facility],  [facility]   SDOH Interventions:     Readmission Risk Interventions     No data to display

## 2023-09-24 DIAGNOSIS — R0602 Shortness of breath: Secondary | ICD-10-CM | POA: Diagnosis not present

## 2023-09-24 NOTE — ED Provider Notes (Signed)
   [facility] Observation Note   ----------------------------------------- 12:55 PM on 09/24/2023 -----------------------------------------  Angie Webb is a 73 y.o. female currently boarding in the Emergency Department.  No acute events since last update.  Recent Vitals   Most recent vital signs: Vitals:   09/23/23 2108 09/23/23 2127  BP: (!) 175/85 (!) 175/82  Pulse:  85  Resp:    Temp:    SpO2:      ED Results / Procedures / Treatments   Labs (all labs ordered are listed, but only abnormal results are displayed) Labs Reviewed  COMPREHENSIVE METABOLIC PANEL WITH GFR - Abnormal; Notable for the following components:      Result Value   Glucose, Bld 116 (*)    All other components within normal limits  CBC WITH DIFFERENTIAL/PLATELET - Abnormal; Notable for the following components:   Hemoglobin 11.7 (*)    RDW 16.0 (*)    All other components within normal limits  URINALYSIS, ROUTINE W REFLEX MICROSCOPIC - Abnormal; Notable for the following components:   Color, Urine YELLOW (*)    APPearance HAZY (*)    Leukocytes,Ua TRACE (*)    Bacteria, UA MANY (*)    All other components within normal limits  MAGNESIUM  - Abnormal; Notable for the following components:   Magnesium  1.5 (*)    All other components within normal limits  BRAIN NATRIURETIC PEPTIDE  CK  TROPONIN I (HIGH SENSITIVITY)    MEDICATIONS ORDERED IN ED: Medications  acetaminophen  (TYLENOL ) tablet 1,000 mg (1,000 mg Oral Given 09/23/23 2117)  cloNIDine  (CATAPRES ) tablet 0.2 mg (0.2 mg Oral Given 09/24/23 1026)  dorzolamide -timolol  (COSOPT ) 2-0.5 % ophthalmic solution 1 drop (1 drop Both Eyes Given 09/24/23 1028)  ferrous sulfate  tablet 325 mg (325 mg Oral Given 09/24/23 1108)  fluticasone  (FLONASE ) 50 MCG/ACT nasal spray 2 spray (2 sprays Each Nare Given 09/24/23 1027)  hydrochlorothiazide  (HYDRODIURIL ) tablet 25 mg (25 mg Oral Given 09/24/23 1029)  labetalol  (NORMODYNE ) tablet  100 mg (100 mg Oral Given 09/24/23 1024)  lisinopril  (ZESTRIL ) tablet 20 mg (20 mg Oral Given 09/24/23 1026)  loratadine  (CLARITIN ) tablet 10 mg (10 mg Oral Given 09/24/23 1025)  pantoprazole  (PROTONIX ) EC tablet 40 mg (40 mg Oral Given 09/24/23 1025)  magnesium  oxide (MAG-OX) tablet 400 mg (400 mg Oral Given 09/22/23 1541)     ED Plan   Currently awaiting placement into a rehabilitation facility.  Social worker is currently working with the patient to facilitate this.    Ruth Cove, MD 09/24/23 1256

## 2023-09-24 NOTE — ED Notes (Signed)
 Breakfast tray given.

## 2023-09-24 NOTE — ED Notes (Signed)
 Bed changed new purewick placed

## 2023-09-24 NOTE — ED Notes (Signed)
 RN spoke with pt upon shift change.  RN offered to assist pt with any needs or wants.  Pt requested some towels to place as a skin barrier and RN provided them.  Pt denies any other requests at this time.  RN ensured pt call light is within reach.

## 2023-09-24 NOTE — ED Notes (Signed)
 Patient is resting with eyes closed, HOB up at 45 degree angle to promote even and unlabored breathing.  Patient awakens easily with verbal stimuli or light touch.  No needs voiced at present.  No acute distress noted.

## 2023-09-24 NOTE — ED Notes (Signed)
 Lunch tray given.

## 2023-09-24 NOTE — ED Notes (Signed)
 Pt rang call light and stated she feels wet.  RN x2 and a nurse tech changed linens, pads, brief, performed peri care, repositioned pt, pt provided with drink.  Pt sitting up comfortably in bed at this time.  Call light within reach of pt.

## 2023-09-24 NOTE — ED Provider Notes (Signed)
 Emergency Medicine Observation Re-evaluation Note  Physical Exam   BP (!) 175/82   Pulse 85   Temp 97.9 F (36.6 C) (Oral)   Resp 20   Ht 5\' 3"  (1.6 m)   Wt (!) 170 kg   SpO2 97%   BMI 66.39 kg/m   Patient appears in no acute distress.  ED Course / MDM   No reported events during my shift at the time of this note.   Pt is awaiting dispo from consultants   Buell Carmin MD    Buell Carmin, MD 09/24/23 [phone]

## 2023-09-24 NOTE — ED Notes (Signed)
 Pt rang call light.  Pt requested lotion to be put on groin.  RN applied barrier cream to inguinal and external genital area.  Pt stated this helped reduce burning sensation.  Pt comfortably resting in bed at this time.

## 2023-09-25 DIAGNOSIS — R0602 Shortness of breath: Secondary | ICD-10-CM | POA: Diagnosis not present

## 2023-09-25 NOTE — TOC Progression Note (Signed)
 Transition of Care [facility]) - Progression Note    Patient Details  Name: Angie Webb MRN: [id number] Date of Birth: January 11, 1951  [facility]) CM/SW Contact  Elmira Haddock, LCSW Phone Number: 09/25/2023, 3:37 PM  Clinical Narrative:    CS presented bed offer [facility]) to patient.  She accepted.  Auth to be started after patient is seen by Physical Therapy tomorrow. TOC Anne to follow up to confirm that patient is on the list in the morning.   Expected Discharge Plan: Skilled Nursing Facility Barriers to Discharge: Continued Medical Work up  Expected Discharge Plan and Services       Living arrangements for the past 2 months: Apartment                                       Social Determinants of Health (SDOH) Interventions SDOH Screenings   Food Insecurity: No Food Insecurity (11/11/2022)   Received from [facility]  Transportation Needs: No Transportation Needs (11/11/2022)   Received from [facility]  Utilities: Low Risk  (11/11/2022)   Received from [facility]  Financial Resource Strain: Low Risk  (11/11/2022)   Received from [facility]  Tobacco Use: Medium Risk (09/22/2023)  Health Literacy: Low Risk  (12/31/2021)   Received from [facility], [facility]    Readmission Risk Interventions     No data to display

## 2023-09-25 NOTE — ED Notes (Signed)
 Patient cleaned and purwick repositioned. Patients bedding and pads changed due to soiled urine. Patient sat up in bed

## 2023-09-26 DIAGNOSIS — R0602 Shortness of breath: Secondary | ICD-10-CM | POA: Diagnosis not present

## 2023-09-26 NOTE — ED Notes (Signed)
 Pt up to side of bed with 1 person assist.

## 2023-09-26 NOTE — ED Notes (Signed)
 Brief and absorbent pad changed and perineal care done.

## 2023-09-26 NOTE — ED Notes (Signed)
 PT at bedside.

## 2023-09-26 NOTE — ED Notes (Signed)
 Breakfast tray delivered to patient bedside within reach.

## 2023-09-26 NOTE — ED Provider Notes (Signed)
-----------------------------------------   4:38 AM on 09/26/2023 -----------------------------------------   Blood pressure (!) 140/58, pulse 90, temperature 98.1 F (36.7 C), temperature source Oral, resp. rate (!) 22, height 5\' 3"  (1.6 m), weight (!) 170 kg, SpO2 95%.  The patient is calm and cooperative at this time.  There have been no acute events since the last update.  Awaiting disposition plan from case management/social work.    Burnice Vassel, Clover Dao, DO 09/26/23 [phone]

## 2023-09-26 NOTE — TOC Transition Note (Addendum)
 Transition of Care [facility]) - Discharge Note   Patient Details  Name: Angie Webb MRN: [id number] Date of Birth: June 15, 1950  Transition [facility] Care [facility]) CM/SW Contact:  Arminda Landmark, RN Phone Number: 09/26/2023, 1:07 PM   Clinical Narrative:    Patient is agreeable to go to [facility] and has been accepted. She worked with PT today in anticipation of SNF placement. Auth started for [employer], TOC to continue to follow 1415: PlanAuthID: [id number]     Barriers to Discharge: Continued Medical Work up   Patient Goals and CMS Choice   CMS Medicare.gov Compare Post Acute Care list provided to:: Patient Choice offered to / list presented to : Patient      Discharge Placement                       Discharge Plan and Services Additional resources added to the After Visit Summary for                                       Social Drivers of Health (SDOH) Interventions SDOH Screenings   Food Insecurity: No Food Insecurity (11/11/2022)   Received from [facility]  Transportation Needs: No Transportation Needs (11/11/2022)   Received from [facility]  Utilities: Low Risk  (11/11/2022)   [facility] from [facility]  Financial Resource Strain: Low Risk  (11/11/2022)   Received from [facility]  Tobacco Use: Medium Risk (09/22/2023)  Health Literacy: Low Risk  (12/31/2021)   Received from [facility], [facility]     Readmission Risk Interventions     No data to display

## 2023-09-26 NOTE — Progress Notes (Signed)
 Physical Therapy Treatment Patient Details Name: Angie Webb MRN: [id number] DOB: [birth year] Today's Date: 09/26/2023   History of Present Illness Pt is a 73 y.o. female presenting to hospital 09/22/23 with c/o multiple falls and weakness; bedbound for about 2 months.    PT Comments  Patient seated EOB upon PT arrival, agreeable to PT tx session. Patient denies pain. Patient agreeable to attempt STS transfer with use of bariatric RW. Bed slightly elevated. Patient able to STS with forward lean, and Max A but unable to fully clear hips to complete, due to patient stopping movement and return to seated position due to extensive fear of falling this date. Would be of benefit for +2 assist available to help progress mobility, and improve patient's confidence with mobility at next session. Patient with soiled brief, assisted with donning clean brief. Patient request to return to supine, inc effort/time, Min A for BLEs. Patient left with all needs in reach. Will continue to follow acutely.    If plan is discharge home, recommend the following: Two people to help with walking and/or transfers;A lot of help with bathing/dressing/bathroom;Assistance with cooking/housework;Assist for transportation;Help with stairs or ramp for entrance   Can travel by private vehicle     No  Equipment Recommendations  Other (comment) (TBD at next facility)    Recommendations for Other Services       Precautions / Restrictions Precautions Precautions: Fall Restrictions Weight Bearing Restrictions Per Provider Order: No     Mobility  Bed Mobility Overal bed mobility: Needs Assistance Bed Mobility: Rolling, Sit to Supine Rolling: Supervision, Used rails (able to log roll with use of rails with supervision, inc time/effort)     Sit to supine: Min assist, HOB elevated, Used rails   General bed mobility comments: increased effort to perform; Min A required for BLE's. Pain with knee flexion noted.     Transfers Overall transfer level: Needs assistance Equipment used: Rolling walker (2 wheels) (Bariatric) Transfers: Sit to/from Stand Sit to Stand: Max assist           General transfer comment: Pt agreeable to attempt STS, trialed x 2 reps with use of Bariatric RW. Patient able to initiate forwad lean, and Max A but up-on transition to stand patient stop movement and return to seated position due to fear of falling. PT providing extensive encouragement and education ensuring safe environment. Pt continue to be limited by fear this date. Would benefit from +2 assist at next session to help promote safety and potentially reduce patient fear with mobility attempts.    Ambulation/Gait                   Stairs             Wheelchair Mobility     Tilt Bed    Modified Rankin (Stroke Patients Only)       Balance Overall balance assessment: Needs assistance Sitting-balance support: No upper extremity supported, Feet unsupported Sitting balance-Leahy Scale: Good Sitting balance - Comments: steady seated balance at EOB with feet supported                                    Communication Communication Communication: No apparent difficulties  Cognition Arousal: Alert Behavior During Therapy: WFL for tasks assessed/performed   PT - Cognitive impairments: No apparent impairments  Following commands: Intact      Cueing Cueing Techniques: Verbal cues  Exercises Other Exercises Other Exercises: PT assisted with pericare, soiled brief removed and clean donned. RN notified.    General Comments        Pertinent Vitals/Pain Pain Assessment Pain Assessment: No/denies pain    Home Living                          Prior Function            PT Goals (current goals can now be found in the care plan section) Acute Rehab PT Goals PT Goal Formulation: With patient Time For Goal Achievement:  10/06/23 Potential to Achieve Goals: Fair Progress towards PT goals: Progressing toward goals    Frequency    Min 2X/week      PT Plan      Co-evaluation              AM-PAC PT "6 Clicks" Mobility   Outcome Measure  Help needed turning from your back to your side while in a flat bed without using bedrails?: A Little Help needed moving from lying on your back to sitting on the side of a flat bed without using bedrails?: A Little Help needed moving to and from a bed to a chair (including a wheelchair)?: Total Help needed standing up from a chair using your arms (e.g., wheelchair or bedside chair)?: Total Help needed to walk in hospital room?: Total Help needed climbing 3-5 steps with a railing? : Total 6 Click Score: 10    End of Session Equipment Utilized During Treatment: Gait belt Activity Tolerance: Patient limited by fatigue Patient left: in bed;with call bell/phone within reach Nurse Communication: Mobility status PT Visit Diagnosis: Other abnormalities of gait and mobility (R26.89);Muscle weakness (generalized) (M62.81);History of falling (Z91.81)     Time: 1030-1051 PT Time Calculation (min) (ACUTE ONLY): 21 min  Charges:    $Therapeutic Activity: 8-22 mins PT General Charges $$ ACUTE PT VISIT: 1 Visit                     Quillian Brunt Fairly, PT, DPT 09/26/23 12:21 PM

## 2023-09-27 DIAGNOSIS — I509 Heart failure, unspecified: Secondary | ICD-10-CM | POA: Diagnosis not present

## 2023-09-27 DIAGNOSIS — Z7401 Bed confinement status: Secondary | ICD-10-CM | POA: Diagnosis not present

## 2023-09-27 DIAGNOSIS — Z9181 History of falling: Secondary | ICD-10-CM | POA: Diagnosis not present

## 2023-09-27 DIAGNOSIS — M170 Bilateral primary osteoarthritis of knee: Secondary | ICD-10-CM | POA: Diagnosis not present

## 2023-09-27 DIAGNOSIS — I110 Hypertensive heart disease with heart failure: Secondary | ICD-10-CM | POA: Diagnosis not present

## 2023-09-27 DIAGNOSIS — K219 Gastro-esophageal reflux disease without esophagitis: Secondary | ICD-10-CM | POA: Diagnosis not present

## 2023-09-27 DIAGNOSIS — R296 Repeated falls: Secondary | ICD-10-CM | POA: Diagnosis not present

## 2023-09-27 DIAGNOSIS — R627 Adult failure to thrive: Secondary | ICD-10-CM | POA: Diagnosis not present

## 2023-09-27 DIAGNOSIS — R6251 Failure to thrive (child): Secondary | ICD-10-CM | POA: Diagnosis not present

## 2023-09-27 DIAGNOSIS — M1990 Unspecified osteoarthritis, unspecified site: Secondary | ICD-10-CM | POA: Diagnosis not present

## 2023-09-27 DIAGNOSIS — H401130 Primary open-angle glaucoma, bilateral, stage unspecified: Secondary | ICD-10-CM | POA: Diagnosis not present

## 2023-09-27 DIAGNOSIS — R0602 Shortness of breath: Secondary | ICD-10-CM | POA: Diagnosis not present

## 2023-09-27 DIAGNOSIS — I959 Hypotension, unspecified: Secondary | ICD-10-CM | POA: Diagnosis not present

## 2023-09-27 DIAGNOSIS — Z7982 Long term (current) use of aspirin: Secondary | ICD-10-CM | POA: Diagnosis not present

## 2023-09-27 DIAGNOSIS — R29898 Other symptoms and signs involving the musculoskeletal system: Secondary | ICD-10-CM | POA: Diagnosis not present

## 2023-09-27 DIAGNOSIS — M6281 Muscle weakness (generalized): Secondary | ICD-10-CM | POA: Diagnosis not present

## 2023-09-27 NOTE — ED Notes (Signed)
 Assisted on and off bedpan, peri care done

## 2023-09-27 NOTE — ED Notes (Signed)
 Brief changed ?

## 2023-09-27 NOTE — ED Notes (Signed)
 Report given to [facility] LPN

## 2023-09-27 NOTE — ED Notes (Signed)
 Pt provided with breakfast tray.

## 2023-09-27 NOTE — ED Provider Notes (Addendum)
 DC Summary ----------------------------------------- 10:58 AM on 09/27/2023 ----------------------------------------- Pt has history of chronic hep C, anxiety, CHF, osteoarthritis, hypertension presents emergency department after multiple falls and weakness. She had been receiving in-home PT with failure to progress.  On arrival to the ED on 09/22/2023, she denied any acute symptoms. She was found to have bilateral lower extremity edema.   Workup with labs and chest xray was unremarkable.  She was evaluated by the hospitalist medicine team as well who had no further recommendations.    PT evaluated the patient and recommended SNF.  On day of discharge 09/27/2023, the patient denies any new acute symptoms. Remains stable for DC to SNF.  Continue previous medication regimen.  Final diagnoses:  Failure to thrive in adult  Weakness of both legs  Multiple falls         Jacquie Maudlin, MD 09/27/23 1325

## 2023-09-27 NOTE — TOC Progression Note (Addendum)
 Transition of Care [facility]) - Progression Note    Patient Details  Name: Angie Webb MRN: [id number] Date of Birth: 12/18/1950  Transition [facility]) CM/SW Contact  Arminda Landmark, RN Phone Number: 09/27/2023, 8:35 AM  Clinical Narrative:    Patient has insurance approval to go to [place]: PlanAuthID: [id number] Dates:4/22-4/24/25 next review date:09/28/23. 0845- spoke with Mylinda Asa at [facility] and they can accept pt today. She will go to room 611B and report to be called to [phone]. Spoke with [place] and she's in agreement and excited about leaving today. 9563- requested DC orders from attending Dr. Murrel Arnt. 1005: Dr. Murrel Arnt state's she's not the attending. Per Sam, ED RN, the MD is Dr. Vicenta Graft and she sent him a message.  1140: DC orders entered, [facility] transportation arranged, friend Alba notified that pt is being Dc'd to [facility]. Per ED RN Sam she's called report to their staff.  Expected Discharge Plan: Skilled Nursing Facility Barriers to Discharge: Continued Medical Work up  Expected Discharge Plan and Services       Living arrangements for the past 2 months: Apartment                                       Social Determinants of Health (SDOH) Interventions SDOH Screenings   Food Insecurity: No Food Insecurity (11/11/2022)   Received from [facility]  Transportation Needs: No Transportation Needs (11/11/2022)   Received from [facility]  Utilities: Low Risk  (11/11/2022)   Received from [facility]  Financial Resource Strain: Low Risk  (11/11/2022)   Received from [facility]  Tobacco Use: Medium Risk (09/22/2023)  Health Literacy: Low Risk  (12/31/2021)   Received from [facility]    Readmission Risk Interventions     No data to display

## 2023-09-27 NOTE — ED Notes (Signed)
 Case manager at bedside and updated pt on plan of care and plans for transport later to [facility]

## 2023-09-29 DIAGNOSIS — I509 Heart failure, unspecified: Secondary | ICD-10-CM | POA: Diagnosis not present

## 2023-09-29 DIAGNOSIS — Z9181 History of falling: Secondary | ICD-10-CM | POA: Diagnosis not present

## 2023-09-29 DIAGNOSIS — M170 Bilateral primary osteoarthritis of knee: Secondary | ICD-10-CM | POA: Diagnosis not present

## 2023-09-29 DIAGNOSIS — K219 Gastro-esophageal reflux disease without esophagitis: Secondary | ICD-10-CM | POA: Diagnosis not present

## 2023-09-29 DIAGNOSIS — I110 Hypertensive heart disease with heart failure: Secondary | ICD-10-CM | POA: Diagnosis not present

## 2023-09-29 DIAGNOSIS — M6281 Muscle weakness (generalized): Secondary | ICD-10-CM | POA: Diagnosis not present

## 2023-09-29 DIAGNOSIS — H401130 Primary open-angle glaucoma, bilateral, stage unspecified: Secondary | ICD-10-CM | POA: Diagnosis not present

## 2023-10-02 DIAGNOSIS — M170 Bilateral primary osteoarthritis of knee: Secondary | ICD-10-CM | POA: Diagnosis not present

## 2023-10-02 DIAGNOSIS — K219 Gastro-esophageal reflux disease without esophagitis: Secondary | ICD-10-CM | POA: Diagnosis not present

## 2023-10-02 DIAGNOSIS — H401130 Primary open-angle glaucoma, bilateral, stage unspecified: Secondary | ICD-10-CM | POA: Diagnosis not present

## 2023-10-02 DIAGNOSIS — Z9181 History of falling: Secondary | ICD-10-CM | POA: Diagnosis not present

## 2023-10-02 DIAGNOSIS — M6281 Muscle weakness (generalized): Secondary | ICD-10-CM | POA: Diagnosis not present

## 2023-10-02 DIAGNOSIS — I110 Hypertensive heart disease with heart failure: Secondary | ICD-10-CM | POA: Diagnosis not present

## 2023-10-02 DIAGNOSIS — I509 Heart failure, unspecified: Secondary | ICD-10-CM | POA: Diagnosis not present

## 2023-10-04 DIAGNOSIS — H401130 Primary open-angle glaucoma, bilateral, stage unspecified: Secondary | ICD-10-CM | POA: Diagnosis not present

## 2023-10-04 DIAGNOSIS — M170 Bilateral primary osteoarthritis of knee: Secondary | ICD-10-CM | POA: Diagnosis not present

## 2023-10-04 DIAGNOSIS — Z9181 History of falling: Secondary | ICD-10-CM | POA: Diagnosis not present

## 2023-10-04 DIAGNOSIS — M6281 Muscle weakness (generalized): Secondary | ICD-10-CM | POA: Diagnosis not present

## 2023-10-04 DIAGNOSIS — K219 Gastro-esophageal reflux disease without esophagitis: Secondary | ICD-10-CM | POA: Diagnosis not present

## 2023-10-04 DIAGNOSIS — I110 Hypertensive heart disease with heart failure: Secondary | ICD-10-CM | POA: Diagnosis not present

## 2023-10-04 DIAGNOSIS — I509 Heart failure, unspecified: Secondary | ICD-10-CM | POA: Diagnosis not present

## 2023-10-05 DIAGNOSIS — Z9181 History of falling: Secondary | ICD-10-CM | POA: Diagnosis not present

## 2023-10-05 DIAGNOSIS — I509 Heart failure, unspecified: Secondary | ICD-10-CM | POA: Diagnosis not present

## 2023-10-05 DIAGNOSIS — M170 Bilateral primary osteoarthritis of knee: Secondary | ICD-10-CM | POA: Diagnosis not present

## 2023-10-05 DIAGNOSIS — H401130 Primary open-angle glaucoma, bilateral, stage unspecified: Secondary | ICD-10-CM | POA: Diagnosis not present

## 2023-10-05 DIAGNOSIS — K219 Gastro-esophageal reflux disease without esophagitis: Secondary | ICD-10-CM | POA: Diagnosis not present

## 2023-10-05 DIAGNOSIS — I110 Hypertensive heart disease with heart failure: Secondary | ICD-10-CM | POA: Diagnosis not present

## 2023-10-06 DIAGNOSIS — K219 Gastro-esophageal reflux disease without esophagitis: Secondary | ICD-10-CM | POA: Diagnosis not present

## 2023-10-06 DIAGNOSIS — M170 Bilateral primary osteoarthritis of knee: Secondary | ICD-10-CM | POA: Diagnosis not present

## 2023-10-06 DIAGNOSIS — H401130 Primary open-angle glaucoma, bilateral, stage unspecified: Secondary | ICD-10-CM | POA: Diagnosis not present

## 2023-10-06 DIAGNOSIS — Z9181 History of falling: Secondary | ICD-10-CM | POA: Diagnosis not present

## 2023-10-06 DIAGNOSIS — M6281 Muscle weakness (generalized): Secondary | ICD-10-CM | POA: Diagnosis not present

## 2023-10-06 DIAGNOSIS — I509 Heart failure, unspecified: Secondary | ICD-10-CM | POA: Diagnosis not present

## 2023-10-06 DIAGNOSIS — I110 Hypertensive heart disease with heart failure: Secondary | ICD-10-CM | POA: Diagnosis not present

## 2023-10-09 DIAGNOSIS — I509 Heart failure, unspecified: Secondary | ICD-10-CM | POA: Diagnosis not present

## 2023-10-09 DIAGNOSIS — H401130 Primary open-angle glaucoma, bilateral, stage unspecified: Secondary | ICD-10-CM | POA: Diagnosis not present

## 2023-10-09 DIAGNOSIS — Z9181 History of falling: Secondary | ICD-10-CM | POA: Diagnosis not present

## 2023-10-09 DIAGNOSIS — M6281 Muscle weakness (generalized): Secondary | ICD-10-CM | POA: Diagnosis not present

## 2023-10-09 DIAGNOSIS — I110 Hypertensive heart disease with heart failure: Secondary | ICD-10-CM | POA: Diagnosis not present

## 2023-10-09 DIAGNOSIS — M170 Bilateral primary osteoarthritis of knee: Secondary | ICD-10-CM | POA: Diagnosis not present

## 2023-10-09 DIAGNOSIS — K219 Gastro-esophageal reflux disease without esophagitis: Secondary | ICD-10-CM | POA: Diagnosis not present

## 2023-10-24 ENCOUNTER — Other Ambulatory Visit: Payer: Self-pay

## 2023-10-24 ENCOUNTER — Emergency Department
Admission: EM | Admit: 2023-10-24 | Discharge: 2023-11-02 | Disposition: A | Discharge status: Other Acute Inpatient Hospital | Attending: Emergency Medicine | Admitting: Emergency Medicine

## 2023-10-24 DIAGNOSIS — R0902 Hypoxemia: Secondary | ICD-10-CM | POA: Diagnosis not present

## 2023-10-24 DIAGNOSIS — I110 Hypertensive heart disease with heart failure: Secondary | ICD-10-CM | POA: Diagnosis not present

## 2023-10-24 DIAGNOSIS — M6250 Muscle wasting and atrophy, not elsewhere classified, unspecified site: Secondary | ICD-10-CM | POA: Diagnosis not present

## 2023-10-24 DIAGNOSIS — R262 Difficulty in walking, not elsewhere classified: Secondary | ICD-10-CM

## 2023-10-24 DIAGNOSIS — R5381 Other malaise: Secondary | ICD-10-CM | POA: Diagnosis not present

## 2023-10-24 DIAGNOSIS — R2689 Other abnormalities of gait and mobility: Secondary | ICD-10-CM | POA: Diagnosis not present

## 2023-10-24 DIAGNOSIS — I509 Heart failure, unspecified: Secondary | ICD-10-CM | POA: Diagnosis not present

## 2023-10-24 DIAGNOSIS — I10 Essential (primary) hypertension: Secondary | ICD-10-CM | POA: Diagnosis not present

## 2023-10-24 DIAGNOSIS — R29898 Other symptoms and signs involving the musculoskeletal system: Secondary | ICD-10-CM

## 2023-10-24 HISTORY — DX: Sleep apnea, unspecified: G47.30

## 2023-10-24 LAB — COMPREHENSIVE METABOLIC PANEL WITH GFR
ALT: 9 U/L (ref 0–44)
AST: 20 U/L (ref 15–41)
Albumin: 3.8 g/dL (ref 3.5–5.0)
Alkaline Phosphatase: 44 U/L (ref 38–126)
Anion gap: 13 (ref 5–15)
BUN: 17 mg/dL (ref 8–23)
CO2: 28 mmol/L (ref 22–32)
Calcium: 9.4 mg/dL (ref 8.9–10.3)
Chloride: 98 mmol/L (ref 98–111)
Creatinine, Ser: 0.97 mg/dL (ref 0.44–1.00)
GFR, Estimated: >60 mL/min (ref >60)
Glucose, Bld: 112 mg/dL — ABNORMAL HIGH (ref 70–99)
Potassium: 4 mmol/L (ref 3.5–5.1)
Sodium: 139 mmol/L (ref 135–145)
Total Bilirubin: 0.8 mg/dL (ref 0.0–1.2)
Total Protein: 7.9 g/dL (ref 6.5–8.1)

## 2023-10-24 LAB — CBC WITH DIFFERENTIAL/PLATELET
Abs Immature Granulocytes: 0.04 10*3/uL (ref 0.00–0.07)
Basophils Absolute: 0 10*3/uL (ref 0.0–0.1)
Basophils Relative: 0 %
Eosinophils Absolute: 0.3 10*3/uL (ref 0.0–0.5)
Eosinophils Relative: 4 %
HCT: 38.5 % (ref 36.0–46.0)
Hemoglobin: 12.4 g/dL (ref 12.0–15.0)
Immature Granulocytes: 1 %
Lymphocytes Relative: 32 %
Lymphs Abs: 2.4 10*3/uL (ref 0.7–4.0)
MCH: 28.4 pg (ref 26.0–34.0)
MCHC: 32.2 g/dL (ref 30.0–36.0)
MCV: 88.3 fL (ref 80.0–100.0)
Monocytes Absolute: 0.6 10*3/uL (ref 0.1–1.0)
Monocytes Relative: 8 %
Neutro Abs: 4.2 10*3/uL (ref 1.7–7.7)
Neutrophils Relative %: 55 %
Platelets: 295 10*3/uL (ref 150–400)
RBC: 4.36 MIL/uL (ref 3.87–5.11)
RDW: 15.9 % — ABNORMAL HIGH (ref 11.5–15.5)
WBC: 7.6 10*3/uL (ref 4.0–10.5)
nRBC: 0 % (ref 0.0–0.2)

## 2023-10-24 LAB — TROPONIN I (HIGH SENSITIVITY)
Troponin I (High Sensitivity): 7 ng/L (ref <18)
Troponin I (High Sensitivity): 7 ng/L (ref <18)

## 2023-10-24 LAB — BRAIN NATRIURETIC PEPTIDE: B Natriuretic Peptide: 18.7 pg/mL (ref 0.0–100.0)

## 2023-10-24 NOTE — ED Notes (Signed)
 Pt needs repeat troponin and new lav top sent down. Unable to pull blood from PIV. Pt has been stuck by ED staff multiple times in attempt to obtain labs. Lab called to have someone come down to stick pt

## 2023-10-24 NOTE — ED Triage Notes (Signed)
  From home [facility] for decreased mobility since 3 weeks, spoke with PCP today who told her come to ER for placement to rehab. Had a fall in February, not really walking since then. HR 89, 96% RA, 170/80, 97.6 oral Hx HTN, CHF. Eating and drinking normally. No NVD. Pt alert, oriented.  Pt states feet sliding around when uses rollator and has not gotten strength back to legs. No recent falls. Denies pain.

## 2023-10-24 NOTE — Group Note (Deleted)
 Date:  10/24/2023 Time:  9:45 PM  Group Topic/Focus:  Wrap-Up Group:   The focus of this group is to help patients review their daily goal of treatment and discuss progress on daily workbooks.     Participation Level:  {BHH PARTICIPATION EAVWU:98119}  Participation Quality:  {BHH PARTICIPATION QUALITY:22265}  Affect:  {BHH AFFECT:22266}  Cognitive:  {BHH COGNITIVE:22267}  Insight: {BHH Insight2:20797}  Engagement in Group:  {BHH ENGAGEMENT IN JYNWG:95621}  Modes of Intervention:  {BHH MODES OF INTERVENTION:22269}  Additional Comments:  ***  Maglione,Preciosa Bundrick E 10/24/2023, 9:45 PM

## 2023-10-24 NOTE — ED Notes (Signed)
Pt provided crackers and drink per request.

## 2023-10-24 NOTE — ED Notes (Signed)
 Four people assisted patient from wheelchair to ED hospital bed with difficulty. Pt unable to assist much in movement.

## 2023-10-24 NOTE — ED Notes (Addendum)
 This NT assisted pt on and off of bedpan. Pt was changed into gown and pure wick was placed at that time as well. Pt is now comfortable and resting in bed, not further assistance needed.

## 2023-10-24 NOTE — ED Provider Notes (Signed)
 [facility] Provider Note   Event Date/Time   First MD Initiated Contact with Patient 10/24/23 1834     (approximate) History  Extremity Weakness and SNF placement  HPI Angie Webb is a 73 y.o. female with a past medical history of morbid obesity, depression/anxiety, CHF, hypertension, and chronic bilateral knee pain who presents after a fall approximately 3 months prior to arrival stating that she has had extreme difficulty getting around her house and performing her ADLs since that time.  Patient was told by her primary care physician to present to the emergency department for placement in an acute rehab facility. ROS: Patient currently denies any vision changes, tinnitus, difficulty speaking, facial droop, sore throat, chest pain, shortness of breath, abdominal pain, nausea/vomiting/diarrhea, dysuria, or weakness/numbness/paresthesias in any extremity   Physical Exam  Triage Vital Signs: ED Triage Vitals  Encounter Vitals Group     BP 10/24/23 1634 (!) 167/80     Systolic BP Percentile --      Diastolic BP Percentile --      Pulse Rate 10/24/23 1634 86     Resp 10/24/23 1634 19     Temp 10/24/23 1634 98.7 F (37.1 C)     Temp Source 10/24/23 1634 Oral     SpO2 10/24/23 1634 97 %     Weight 10/24/23 1635 (!) 350 lb (158.8 kg)     Height 10/24/23 1635 5\' 3"  (1.6 m)     Head Circumference --      [place] Flow --      Pain Score 10/24/23 1636 0     Pain Loc --      Pain Education --      Exclude from Growth Chart --    Most recent vital signs: Vitals:   10/24/23 1634  BP: (!) 167/80  Pulse: 86  Resp: 19  Temp: 98.7 F (37.1 C)  SpO2: 97%   General: Awake, oriented x4. CV:  Good peripheral perfusion.  Resp:  Normal effort.  Abd:  No distention.  Other:  Morbidly obese elderly African-American female resting comfortably in no acute distress ED Results / Procedures / Treatments  Labs (all labs ordered are listed, but only abnormal results are  displayed) Labs Reviewed  COMPREHENSIVE METABOLIC PANEL WITH GFR - Abnormal; Notable for the following components:      Result Value   Glucose, Bld 112 (*)    All other components within normal limits  CBC WITH DIFFERENTIAL/PLATELET - Abnormal; Notable for the following components:   RDW 15.9 (*)    All other components within normal limits  BRAIN NATRIURETIC PEPTIDE  CBC WITH DIFFERENTIAL/PLATELET  URINALYSIS, ROUTINE W REFLEX MICROSCOPIC  TROPONIN I (HIGH SENSITIVITY)  TROPONIN I (HIGH SENSITIVITY)   EKG ED ECG REPORT I, Charleen Conn, the attending physician, personally viewed and interpreted this ECG. Date: 10/24/2023 EKG Time: 2237 Rate: 88 Rhythm: normal sinus rhythm QRS Axis: normal Intervals: normal ST/T Wave abnormalities: normal Narrative Interpretation: no evidence of acute ischemia PROCEDURES: Critical Care performed: No Procedures MEDICATIONS ORDERED IN ED: Medications - No data to display IMPRESSION / MDM / ASSESSMENT AND PLAN / ED COURSE  I reviewed the triage vital signs and the nursing notes.                             The patient is on the cardiac monitor to evaluate for evidence of arrhythmia and/or significant heart rate changes.  Patient's presentation is most consistent with acute presentation with potential threat to life or bodily function. Patient is a 73 year old female who presents at the request of her primary care physician for placement in adult acute rehab.  Patient has had decreased exercise tolerance and increased generalized weakness resulting in inability for her to perform any ADLs, get up from a seated position, or move around her house at all.  Patient has had multiple falls recently.  Laboratory evaluation shows no evidence of acute abnormalities.  Patient will require boarding in our emergency department for physical and Occupational Therapy evaluations for likely placement in a rehabilitation  Dispo: boarder   FINAL CLINICAL  IMPRESSION(S) / ED DIAGNOSES   Final diagnoses:  Severe muscle deconditioning  Morbid obesity (HCC)  Ambulatory dysfunction   Rx / DC Orders   ED Discharge Orders     None      Note:  This document was prepared using Dragon voice recognition software and may include unintentional dictation errors.   Nolon Yellin K, MD 10/25/23 [phone]

## 2023-10-25 DIAGNOSIS — R2689 Other abnormalities of gait and mobility: Secondary | ICD-10-CM | POA: Diagnosis not present

## 2023-10-25 LAB — URINALYSIS, ROUTINE W REFLEX MICROSCOPIC
Bilirubin Urine: NEGATIVE
Glucose, UA: NEGATIVE mg/dL
Hgb urine dipstick: NEGATIVE
Ketones, ur: NEGATIVE mg/dL
Nitrite: NEGATIVE
Protein, ur: NEGATIVE mg/dL
RBC / HPF: 0 RBC/hpf (ref 0–5)
Specific Gravity, Urine: 1.017 (ref 1.005–1.030)
pH: 5 (ref 5.0–8.0)

## 2023-10-25 MED ORDER — LABETALOL HCL 100 MG PO TABS
100.0000 mg | ORAL_TABLET | Freq: Two times a day (BID) | ORAL | Status: DC
  Administered 2023-10-26 – 2023-11-02 (×14): 100 mg via ORAL
  Filled 2023-10-25 (×21): qty 1

## 2023-10-25 MED ORDER — LISINOPRIL 10 MG PO TABS
20.0000 mg | ORAL_TABLET | Freq: Two times a day (BID) | ORAL | Status: DC
  Administered 2023-10-26 – 2023-11-02 (×16): 20 mg via ORAL
  Filled 2023-10-25 (×16): qty 2

## 2023-10-25 MED ORDER — TORSEMIDE 20 MG PO TABS
20.0000 mg | ORAL_TABLET | ORAL | Status: DC
  Administered 2023-10-26 – 2023-11-01 (×4): 20 mg via ORAL
  Filled 2023-10-25 (×4): qty 1

## 2023-10-25 MED ORDER — FERROUS SULFATE 325 (65 FE) MG PO TABS
325.0000 mg | ORAL_TABLET | Freq: Every day | ORAL | Status: DC
  Administered 2023-10-26 – 2023-11-02 (×8): 325 mg via ORAL
  Filled 2023-10-25 (×8): qty 1

## 2023-10-25 MED ORDER — PANTOPRAZOLE SODIUM 40 MG PO TBEC
40.0000 mg | DELAYED_RELEASE_TABLET | Freq: Every day | ORAL | Status: DC
  Administered 2023-10-26 – 2023-11-02 (×8): 40 mg via ORAL
  Filled 2023-10-25 (×8): qty 1

## 2023-10-25 MED ORDER — DIPHENHYDRAMINE HCL 25 MG PO CAPS
25.0000 mg | ORAL_CAPSULE | Freq: Once | ORAL | Status: AC
  Administered 2023-10-26: 25 mg via ORAL
  Filled 2023-10-25: qty 1

## 2023-10-25 MED ORDER — LORATADINE 10 MG PO TABS
10.0000 mg | ORAL_TABLET | Freq: Every day | ORAL | Status: DC
  Administered 2023-10-26 – 2023-11-02 (×8): 10 mg via ORAL
  Filled 2023-10-25 (×8): qty 1

## 2023-10-25 MED ORDER — DORZOLAMIDE HCL-TIMOLOL MAL 2-0.5 % OP SOLN
1.0000 [drp] | Freq: Two times a day (BID) | OPHTHALMIC | Status: DC
  Administered 2023-10-26 – 2023-11-02 (×15): 1 [drp] via OPHTHALMIC
  Filled 2023-10-25: qty 10

## 2023-10-25 MED ORDER — ACETAMINOPHEN 325 MG PO TABS
650.0000 mg | ORAL_TABLET | Freq: Four times a day (QID) | ORAL | Status: DC | PRN
  Administered 2023-10-25 – 2023-11-01 (×5): 650 mg via ORAL
  Filled 2023-10-25 (×6): qty 2

## 2023-10-25 MED ORDER — ASPIRIN 81 MG PO TBEC
81.0000 mg | DELAYED_RELEASE_TABLET | Freq: Every day | ORAL | Status: DC
  Administered 2023-10-26 – 2023-11-02 (×8): 81 mg via ORAL
  Filled 2023-10-25 (×8): qty 1

## 2023-10-25 MED ORDER — HYDROCHLOROTHIAZIDE 25 MG PO TABS
25.0000 mg | ORAL_TABLET | ORAL | Status: DC
  Administered 2023-10-26 – 2023-11-01 (×4): 25 mg via ORAL
  Filled 2023-10-25 (×4): qty 1

## 2023-10-25 NOTE — ED Notes (Signed)
 Patient given warm wipes.

## 2023-10-25 NOTE — ED Notes (Signed)
 DSS, at pt bedside to follow up with pt.

## 2023-10-25 NOTE — TOC Progression Note (Signed)
 Transition of Care [facility]) - Progression Note    Patient Details  Name: KYLIAH DEANDA MRN: [id number] Date of Birth: 1950/06/18  [facility] CM/SW Contact  Zoe Hinds, RN Phone Number: 10/25/2023, 4:53 PM  Clinical Narrative:    This CM met with pt again at bedside in f/u to her speaking with her DSS SW regarding her DC plan . Pt agreed with going to another SNF for rehab and provided her 1st choice as [facility] & Her 2cd choice as [facility] . Referrals sent New FL2 completed and PASSAR obtained . TOC will cont to follow dc planning/ care coordination and update as applicable.    Expected Discharge Plan:  (TBD) Barriers to Discharge: Unsafe home situation  Expected Discharge Plan and Services In-house Referral: Clinical Social Work Discharge Planning Services: CM Consult Post Acute Care Choice:  (TBD) Living arrangements for the past 2 months: Skilled Nursing Facility, Apartment                                       Social Determinants of Health (SDOH) Interventions SDOH Screenings   Food Insecurity: No Food Insecurity (11/11/2022)   Received from [facility]  Transportation Needs: No Transportation Needs (11/11/2022)   Received from [facility]  Utilities: Low Risk  (11/11/2022)   Received from [facility]  Financial Resource Strain: Low Risk  (11/11/2022)   Received from [facility]  Tobacco Use: Medium Risk (10/24/2023)  Health Literacy: Low Risk  (12/31/2021)   Received from [facility]    Readmission Risk Interventions     No data to display

## 2023-10-25 NOTE — NC FL2 (Signed)
 [place]  MEDICAID FL2 LEVEL OF CARE FORM     IDENTIFICATION  Patient Name: Angie Webb Birthdate: [birth year] Sex: female Admission Date (Current Location): 10/24/2023  [place] and [place] Number:  Angie Webb [id number] K Facility and Address:  [facility], [address], [place], [place] [address]      Provider Number: [id number]  Attending Physician Name and Address:  No att. providers found  Relative Name and Phone Number:  Candelaria Chaco (Sister) [phone]    Current Level of Care: Hospital Recommended Level of Care: Skilled Nursing Facility Prior Approval Number:    Date Approved/Denied:   PASRR Number: [id number]  Discharge Plan: SNF    Current Diagnoses: Patient Active Problem List   Diagnosis Date Noted   Aortic atherosclerosis (HCC) 03/06/2019   Urinary incontinence in female 09/13/2016   Hypochromic erythrocytes 10/15/2015   Impaired fasting glucose 10/15/2015   Abnormal weight gain 09/30/2015   Medication monitoring encounter 09/30/2015   Weakness of both lower extremities 03/03/2015   Hx of hepatitis C 02/18/2015   Anxiety and depression 02/18/2015   Glaucoma 02/18/2015   Hypertension goal BP (blood pressure) < 140/90 02/18/2015   Morbid obesity with BMI of 50.0-59.9, adult (HCC) 02/18/2015   Obstructive sleep apnea of adult 02/18/2015   Chronic post-traumatic stress disorder 02/18/2015   Other bilateral secondary osteoarthritis of knee 02/18/2015   Allergic rhinitis 10/30/2012   Acid reflux 10/30/2012   Idiopathic localized osteoarthropathy 01/16/2012   Chronic systolic congestive heart failure, NYHA class 2 (HCC) 04/05/2011   Clinical depression 02/27/2009    Orientation RESPIRATION BLADDER Height & Weight     Self, Time, Situation, Place  Normal Continent Weight: (!) 160 kg Height:  5\' 3"  (160 cm)  BEHAVIORAL SYMPTOMS/MOOD NEUROLOGICAL BOWEL NUTRITION STATUS      Continent Diet  AMBULATORY STATUS COMMUNICATION  OF NEEDS Skin   Extensive Assist Verbally Normal                       Personal Care Assistance Level of Assistance  Bathing, Dressing Bathing Assistance: Limited assistance Feeding assistance: Independent Dressing Assistance: Limited assistance     Functional Limitations Info             SPECIAL CARE FACTORS FREQUENCY  PT (By licensed PT), OT (By licensed OT)     PT Frequency: 5 times per wk OT Frequency: 5 times per wk            Contractures Contractures Info: Not present    Additional Factors Info      Allergies Info: Bee Venom           Current Medications (10/25/2023):  This is the current hospital active medication list Current Facility-Administered Medications  Medication Dose Route Frequency Provider Last Rate Last Admin   acetaminophen  (TYLENOL ) tablet 650 mg  650 mg Oral Q6H PRN Mian, Michael A, MD   650 mg at 10/25/23 8413   Current Outpatient Medications  Medication Sig Dispense Refill   acetaminophen  (TYLENOL ) 500 MG tablet Take 1,000 mg by mouth every 6 (six) hours as needed for mild pain (pain score 1-3) or moderate pain (pain score 4-6).     aspirin EC 81 MG tablet Take 81 mg by mouth daily.     diclofenac sodium (VOLTAREN) 1 % GEL Apply topically as needed.     dorzolamide -timolol  (COSOPT ) 2-0.5 % ophthalmic solution Place 1 drop into both eyes 2 (two) times daily.     ferrous sulfate  325 (  65 FE) MG EC tablet Take 325 mg by mouth daily with breakfast.     fluticasone  (FLONASE ) 50 MCG/ACT nasal spray Place 2 sprays into both nostrils daily. (Patient taking differently: Place 2 sprays into both nostrils daily as needed for allergies.) 16 g 5   hydrochlorothiazide  (HYDRODIURIL ) 25 MG tablet Take 25 mg by mouth every other day. Alternating with Torsemide 20 mg every other day     labetalol  (NORMODYNE ) 100 MG tablet Take 1 tablet (100 mg total) by mouth 2 (two) times daily. 60 tablet 0   latanoprost (XALATAN) 0.005 % ophthalmic solution Place 1  drop into both eyes at bedtime.     lisinopril  (PRINIVIL ,ZESTRIL ) 20 MG tablet Take 20 mg by mouth 2 (two) times daily.     loratadine  (CLARITIN  REDITABS) 10 MG dissolvable tablet Take 10 mg by mouth daily.     nystatin  cream (MYCOSTATIN ) Apply topically 2 (two) times daily as needed for dry skin. To affected area(s) 30 g 1   omeprazole  (PRILOSEC) 20 MG capsule Take 1 capsule (20 mg total) by mouth daily. 30 capsule 0   torsemide (DEMADEX) 20 MG tablet Take 20 mg by mouth every other day. Alternating with Hydrochlorothiazide  25 mg every other day     VITAMIN D PO Take 1 tablet by mouth daily.     cloNIDine  (CATAPRES ) 0.2 MG tablet Take 0.2 mg by mouth 2 (two) times daily. (Patient not taking: Reported on 10/25/2023)     Misc. Devices [facility]) MISC 1 Device by Does not apply route daily. 1 each 0   Saccharomyces boulardii (PROBIOTIC) 250 MG CAPS Take 1 capsule by mouth in the morning and at bedtime. (Patient not taking: Reported on 09/22/2023) 30 capsule 0   timolol  (TIMOPTIC ) 0.5 % ophthalmic solution  (Patient not taking: Reported on 09/22/2023)       Discharge Medications: Please see discharge summary for a list of discharge medications.  Relevant Imaging Results:  Relevant Lab Results:   Additional Information [id number]  Angie Hinds, RN

## 2023-10-25 NOTE — ED Notes (Signed)
 PT and OT currently at bedside.

## 2023-10-25 NOTE — Evaluation (Signed)
 Physical Therapy Evaluation Patient Details Name: Angie Webb MRN: [id number] DOB: [birth year] Today's Date: 10/25/2023  History of Present Illness  Pt is a 73 y.o. female who presents after a fall approximately 3 months prior to arrival stating that she has had extreme difficulty getting around her house and performing her ADLs since that time. PCP told her to present to the emergency department for placement in an acute rehab facility. PMH of morbid obesity, depression/anxiety, CHF,  hep C, anxiety, osteoarthritis, hypertension, and chronic bilateral knee pain   Clinical Impression  Pt admitted with above diagnosis. Pt currently with functional limitations due to the deficits listed below (see PT Problem List). Pt received upright in bed agreeable to PT/OT co-eval for safe pt mobility. Reports PTA pt was in rehab in March ambulating in // bars and using w/c for mobility performing slide board transfers. Had some sort of incident leading to her d/c'ing home earlier without equipment and was reliant on bed level care and paying 2 neighbors to assist with meals and ADL's, changing bed pan, etc. Reports being indep with bed mobility and sitting unsupported EOB but unable to stand and neighbors were too scared to assist standing pt as well. Pt's ultimate goal is to be independent again even if it is at w/c level.  To date, pt is CGA to minA for supine to sit. Is effortful and reliant on bed features to complete. X2 STS efforts totalA+2 with unsuccessful clearance of gluts due to B knee and foot pain but also appearing very anxious which also inhibits full attempts. Very laborious and overall unsuccessful lateral scoots to the L towards [facility] ultimately relying on maxA+2 for sit to supine and scooting up in bed. CGA to minA for rolling R/L for chuck pad placement as well using bed features. Pt very SOB with mobility attempts with body habitus and weakness greatly limiting mobility progression. Pt would  benefit from skilled PT services < 3 hours/day to address her deficits and maximize independence to meet pt goals. Pt upright in bed with all needs in reach upon exit.      If plan is discharge home, recommend the following: Two people to help with walking and/or transfers;Assistance with cooking/housework;Assist for transportation;Help with stairs or ramp for entrance;Two people to help with bathing/dressing/bathroom   Can travel by private vehicle   No    Equipment Recommendations Other (comment) (TBD)  Recommendations for Other Services       Functional Status Assessment Patient has had a recent decline in their functional status and demonstrates the ability to make significant improvements in function in a reasonable and predictable amount of time.     Precautions / Restrictions Precautions Precautions: Fall Restrictions Weight Bearing Restrictions Per Provider Order: No      Mobility  Bed Mobility Overal bed mobility: Needs Assistance Bed Mobility: Rolling, Sit to Supine, Supine to Sit Rolling: Used rails, Contact guard assist, Min assist   Supine to sit: HOB elevated, Used rails, Contact guard Sit to supine: HOB elevated, Used rails, +2 for physical assistance, Max assist   General bed mobility comments: increased assist needed to return to supine to manage BLEs and trunk d/t pain and fatigue; max A x2 to scoot up to [facility] with pt attempting to assist; unable to lateral scoot while seated EOB Patient Response: Cooperative, Anxious  Transfers Overall transfer level: Needs assistance Equipment used: Rolling walker (2 wheels) (bariatric) Transfers: Sit to/from Stand Sit to Stand: From elevated surface, Total assist, +2  physical assistance           General transfer comment: unable to clear buttocks from minimally elevated bed with total assist x2 d/t bil knee pain per pt pm 2 attempts; appeared anxious to stand, used rocking for momentum    Ambulation/Gait                   Stairs            Wheelchair Mobility     Tilt Bed Tilt Bed Patient Response: Cooperative, Anxious  Modified Rankin (Stroke Patients Only)       Balance Overall balance assessment: Needs assistance Sitting-balance support: No upper extremity supported, Feet unsupported Sitting balance-Leahy Scale: Good Sitting balance - Comments: steady seated balance at EOB with feet supported     Standing balance-Leahy Scale: Zero Standing balance comment: unable to stand this date                             Pertinent Vitals/Pain Pain Assessment Pain Assessment: Faces Faces Pain Scale: Hurts whole lot Pain Location: bil knees and bottoms of her feet Pain Descriptors / Indicators: Sore, Aching Pain Intervention(s): Monitored during session, Limited activity within patient's tolerance    Home Living Family/patient expects to be discharged to:: Private residence Living Arrangements: Alone Available Help at Discharge: Neighbor Type of Home: Apartment (7th floor with elevator access) Home Access: Elevator       Home Layout: One level Home Equipment: Agricultural consultant (2 wheels);Rollator (4 wheels);BSC/3in1;Shower seat;Grab bars - tub/shower;Grab bars - toilet Additional Comments: reports was using a sliding board for bed to chair transfers at rehab and walking in parallel bars some    Prior Function Prior Level of Function : Needs assist             Mobility Comments: Pt reports being modified independent ambulating with rollator in February and driving but has been bedbound for last 2-3 months since fall in February (was receiving HHPT 3x/week but stopped about 1 month ago;  pt reports she has been doing ex's, sitting on EOB; recent falls; reports walking in parallel bars at rehab and leaving 4 days early d/t harrassment from staff therefore no W/C or sliding board sent home with her ADLs Comments: Pt uses pads or bed pan for toileting in bed.  2  ladies down the hall (neighbors) assist pt as needed (brings water for bathing--pt sponge baths on own in bed; brings breakfast, lunch, and dinner).     Extremity/Trunk Assessment   Upper Extremity Assessment Upper Extremity Assessment: Overall WFL for tasks assessed    Lower Extremity Assessment Lower Extremity Assessment: Generalized weakness       Communication   Communication Communication: No apparent difficulties    Cognition Arousal: Alert Behavior During Therapy: WFL for tasks assessed/performed   PT - Cognitive impairments: No apparent impairments                         Following commands: Intact       Cueing Cueing Techniques: Verbal cues     General Comments General comments (skin integrity, edema, etc.): BP: 173/91 mm Hg    Exercises     Assessment/Plan    PT Assessment Patient needs continued PT services  PT Problem List Decreased strength;Decreased activity tolerance;Decreased balance;Decreased mobility;Obesity       PT Treatment Interventions DME instruction;Gait training;Functional mobility training;Therapeutic activities;Therapeutic exercise;Balance training;Patient/family education;Neuromuscular  re-education    PT Goals (Current goals can be found in the Care Plan section)  Acute Rehab PT Goals Patient Stated Goal: living independently even if w/c level in her home PT Goal Formulation: With patient Time For Goal Achievement: 11/08/23 Potential to Achieve Goals: Fair    Frequency Min 2X/week     Co-evaluation               AM-PAC PT "6 Clicks" Mobility  Outcome Measure Help needed turning from your back to your side while in a flat bed without using bedrails?: A Lot Help needed moving from lying on your back to sitting on the side of a flat bed without using bedrails?: A Lot Help needed moving to and from a bed to a chair (including a wheelchair)?: Total Help needed standing up from a chair using your arms (e.g.,  wheelchair or bedside chair)?: Total Help needed to walk in hospital room?: Total Help needed climbing 3-5 steps with a railing? : Total 6 Click Score: 8    End of Session Equipment Utilized During Treatment: Gait belt Activity Tolerance: Patient limited by fatigue;Patient limited by pain Patient left: in bed;with bed alarm set Nurse Communication: Mobility status PT Visit Diagnosis: Other abnormalities of gait and mobility (R26.89);Muscle weakness (generalized) (M62.81);History of falling (Z91.81)    Time: 1610-9604 PT Time Calculation (min) (ACUTE ONLY): 34 min   Charges:   PT Evaluation $PT Eval Moderate Complexity: 1 Mod PT Treatments $Therapeutic Activity: 8-22 mins PT General Charges $$ ACUTE PT VISIT: 1 Visit        Marc Senior. Fairly IV, PT, DPT Physical Therapist- [facility]  10/25/2023, 10:46 AM

## 2023-10-25 NOTE — Evaluation (Signed)
 Occupational Therapy Evaluation Patient Details Name: Angie Webb MRN: [id number] DOB: 1950-07-13 Today's Date: 10/25/2023   History of Present Illness   Pt is a 73 y.o. female who presents after a fall approximately 3 months prior to arrival stating that she has had extreme difficulty getting around her house and performing her ADLs since that time. PCP told her to present to the emergency department for placement in an acute rehab facility. PMH of morbid obesity, depression/anxiety, CHF,  hep C, anxiety, osteoarthritis, hypertension, and chronic bilateral knee pain     Clinical Impressions Pt was seen for PT/OT co-evaluation this date to maximize pt/therapist safety. Pt lives alone in a 7th level apartment with elevator access. She reports being bedbound for the last 3 months with neighbors being paid to come in and assist her. She reports being a set up assist for ADLs at bed level and MOD I for bed mobility tasks, but has not been able to stand or get OOB since return home from rehab. Reports she was using a sliding board for transfers and walking in parallel bars at rehab facility. She has had recent falls.   Pt presents to acute OT demonstrating impaired ADL performance and functional mobility 2/2 weakness, pain, low activity tolerance and balance deficits. Pt currently requires CGA for supine to sit at EOB and forward scoot. Attempted STS x2 this session with total assist x2 and pt unable to clear her buttocks from the minimally elevated bed height via rocking technique. Pt reports bil knee pain as her limiting factor and pt also appeared anxious to stand. Attempted lateral scooting to [facility] with pt ultimately unable to do so needed Max A x2 to return to supine and scoot to [facility]. Pt is able to [place] sit in bed with MOD I using bed rails and requiring Mod A to don bil socks. Pt fatigues easily needing increased time between tasks for recovery. Pt would benefit from skilled OT services to address  noted impairments and functional limitations to maximize safety and independence while minimizing falls risk and caregiver burden. Do anticipate the need for follow up OT services upon acute hospital DC.       If plan is discharge home, recommend the following:   Two people to help with walking and/or transfers;A lot of help with bathing/dressing/bathroom     Functional Status Assessment   Patient has had a recent decline in their functional status and demonstrates the ability to make significant improvements in function in a reasonable and predictable amount of time.     Equipment Recommendations   Wheelchair cushion (measurements OT);Wheelchair (measurements OT);Other (comment) (sliding board; defer to next venue)     Recommendations for Other Services         Precautions/Restrictions   Precautions Precautions: Fall Restrictions Weight Bearing Restrictions Per Provider Order: No     Mobility Bed Mobility Overal bed mobility: Needs Assistance Bed Mobility: Rolling, Sit to Supine, Supine to Sit Rolling: Used rails, Contact guard assist, Min assist   Supine to sit: HOB elevated, Used rails, Contact guard Sit to supine: HOB elevated, Used rails, +2 for physical assistance, Max assist   General bed mobility comments: increased assist needed to return to supine to manage BLEs and trunk d/t pain and fatigue; max A x2 to scoot up to [facility] with pt attempting to assist; unable to lateral scoot while seated EOB    Transfers Overall transfer level: Needs assistance Equipment used: Rolling walker (2 wheels) (baritatric) Transfers: Sit to/from  Stand Sit to Stand: From elevated surface, Total assist, +2 physical assistance           General transfer comment: unable to clear buttocks from minimally elevated bed with total assist x2 d/t bil knee pain per pt pm 2 attempts; appeared anxious to stand, used rocking for momentum      Balance Overall balance assessment:  Needs assistance Sitting-balance support: No upper extremity supported, Feet unsupported Sitting balance-Leahy Scale: Good Sitting balance - Comments: steady seated balance at EOB with feet supported     Standing balance-Leahy Scale: Zero Standing balance comment: unable to stand this date                           ADL either performed or assessed with clinical judgement   ADL Overall ADL's : Needs assistance/impaired                     Lower Body Dressing: Bed level;Sitting/lateral leans;Moderate assistance Lower Body Dressing Details (indicate cue type and reason): [place] sitting in bed               General ADL Comments: anticipate mod/max a for LB ADLs and Set up for UB ADLs     Vision         Perception         Praxis         Pertinent Vitals/Pain Pain Assessment Pain Assessment: Faces Faces Pain Scale: Hurts whole lot Pain Location: bil knees and bottoms of her feet Pain Descriptors / Indicators: Sore, Aching Pain Intervention(s): Monitored during session, Repositioned, Limited activity within patient's tolerance     Extremity/Trunk Assessment Upper Extremity Assessment Upper Extremity Assessment: Overall WFL for tasks assessed   Lower Extremity Assessment Lower Extremity Assessment: Generalized weakness       Communication Communication Communication: No apparent difficulties   Cognition Arousal: Alert Behavior During Therapy: WFL for tasks assessed/performed                                 Following commands: Intact       Cueing  General Comments   Cueing Techniques: Verbal cues  BP elevated during session   Exercises Other Exercises Other Exercises: Edu on role of OT in acute setting and discussed goals of therapy.   Shoulder Instructions      Home Living Family/patient expects to be discharged to:: Private residence Living Arrangements: Alone Available Help at Discharge: Neighbor Type of Home:  Apartment (7th floor with elevator access) Home Access: Elevator     Home Layout: One level     Bathroom Shower/Tub: Chief Strategy Officer: Handicapped height     Home Equipment: Agricultural consultant (2 wheels);Rollator (4 wheels);BSC/3in1;Shower seat;Grab bars - tub/shower;Grab bars - toilet   Additional Comments: reports was using a sliding board for bed to chair transfers at rehab and walking in parallel bars some      Prior Functioning/Environment Prior Level of Function : Needs assist             Mobility Comments: Pt reports being modified independent ambulating with rollator in February and driving but has been bedbound for last 2-3 months since fall in February (was receiving HHPT 3x/week but stopped about 1 month ago;  pt reports she has been doing ex's, sitting on EOB; recent falls; reports walking in parallel bars at rehab  and leaving 4 days early d/t harrassment from staff therefore no W/C or sliding board sent home with her ADLs Comments: Pt uses pads or bed pan for toileting in bed.  2 ladies down the hall (neighbors) assist pt as needed (brings water for bathing--pt sponge baths on own in bed; brings breakfast, lunch, and dinner).    OT Problem List: Decreased strength;Obesity;Impaired balance (sitting and/or standing);Decreased activity tolerance;Pain   OT Treatment/Interventions: Self-care/ADL training;Balance training;Therapeutic exercise;Therapeutic activities;Patient/family education;DME and/or AE instruction      OT Goals(Current goals can be found in the care plan section)   Acute Rehab OT Goals Patient Stated Goal: be able to transfer to a W/C OT Goal Formulation: With patient Time For Goal Achievement: 11/08/23 Potential to Achieve Goals: Fair ADL Goals Pt Will Perform Lower Body Bathing: sitting/lateral leans;bed level;with set-up Pt Will Perform Lower Body Dressing: with supervision;sitting/lateral leans;bed level Pt Will Transfer to  Toilet: with transfer board;bedside commode;regular height toilet;grab bars;with mod assist   OT Frequency:  Min 2X/week    Co-evaluation              AM-PAC OT "6 Clicks" Daily Activity     Outcome Measure Help from another person eating meals?: None Help from another person taking care of personal grooming?: A Little Help from another person toileting, which includes using toliet, bedpan, or urinal?: A Lot Help from another person bathing (including washing, rinsing, drying)?: A Lot Help from another person to put on and taking off regular upper body clothing?: A Little Help from another person to put on and taking off regular lower body clothing?: A Lot 6 Click Score: 16   End of Session Equipment Utilized During Treatment: Rolling walker (2 wheels);Gait belt (bariatric) Nurse Communication: Mobility status  Activity Tolerance: Patient tolerated treatment well Patient left: in bed;with call bell/phone within reach;with bed alarm set  OT Visit Diagnosis: Other abnormalities of gait and mobility (R26.89);Muscle weakness (generalized) (M62.81);History of falling (Z91.81)                Time: 4401-0272 OT Time Calculation (min): 32 min Charges:  OT General Charges $OT Visit: 1 Visit OT Evaluation $OT Eval Moderate Complexity: 1 Mod Karryn Kosinski, OTR/L 10/25/23, 10:25 AM  Leonard Raker 10/25/2023, 10:21 AM

## 2023-10-25 NOTE — ED Notes (Addendum)
 Pt repositioned in bed.

## 2023-10-25 NOTE — ED Notes (Signed)
 Patient pulled up in bed by two Rns.

## 2023-10-25 NOTE — ED Notes (Signed)
 Patient with legs between the bars on the stretcher. This Rn has attempted to redirect her to help her get her legs removed from between the bars. This Rn and charge RN Pattie Borders attempted to help pt to move her legs. Pt refuses to let anyone touch her, or help her get repositioned. Pt currently standing with her legs between the rails on the bed.

## 2023-10-25 NOTE — ED Notes (Addendum)
 Pt given a tv remote

## 2023-10-25 NOTE — ED Notes (Signed)
Patient taken off of bed pan.

## 2023-10-25 NOTE — ED Notes (Signed)
 Pt placed on bedpan

## 2023-10-25 NOTE — TOC Progression Note (Signed)
 Transition of Care [facility]) - Progression Note    Patient Details  Name: Angie Webb MRN: [id number] Date of Birth: [birth year]  Transition of Care [facility] CM/SW Contact  Zoe Hinds, RN Phone Number: 10/25/2023, 2:38 PM  Clinical Narrative:    This CM met with at bedside and introduced role  . This CM discussed in great detailed pt's desire for her dc plan and informed that PT /OT recommend SNF for rehab. This CM confirmed with pt that she left AMA from [facility] on 10/09/23 to home. Pt informed she has limited support at home that include 2 neighbors that assist with meal preparation and setting pt up for  ADL'S . Pt also informed she has a friend named Scientist, research (medical) that has been coming to assist pt with ADL'S 5 x wk that she pays out of pocket $300 a month. Pt shared she has DME at home that includes lyft chair, BSC,shower chair, & rollator with seat. Pt informed she purchased a motorized travel w/c that she paid for out of pocket that's currently  in route being delivered any day to her address, Pt further shared she has a DSS SW assigned to her through [facility] after her AMA dc from [employer] named Eula Hey @[phone] who is at bedside with pt  currently discussing dc plan with pt . Pt has transportation to PCP appts through [employer] . Pt is seen by Dr. Kirby Peoples [facility], Primary Care [facility] [address] [place], [place] [address]. Pt informed she was not sure what she wanted to do regarding her DC plan until she spoke with her DSS case manager .  TOC will cont to follow dc planning / care coordination and update as applicable.        Expected Discharge Plan:  (TBD) Barriers to Discharge: Unsafe home situation  Expected Discharge Plan and Services In-house Referral: Clinical Social Work Discharge Planning Services: CM Consult Post Acute Care Choice:  (TBD) Living arrangements for the past  2 months: Skilled Nursing Facility, Apartment                                       Social Determinants of Health (SDOH) Interventions SDOH Screenings   Food Insecurity: No Food Insecurity (11/11/2022)   Received from [facility]  Transportation Needs: No Transportation Needs (11/11/2022)   Received from [facility]  Utilities: Low Risk  (11/11/2022)   Received from [facility]  Financial Resource Strain: Low Risk  (11/11/2022)   Received from [facility]  Tobacco Use: Medium Risk (10/24/2023)  Health Literacy: Low Risk  (12/31/2021)   Received from [facility]    Readmission Risk Interventions     No data to display

## 2023-10-26 DIAGNOSIS — R2689 Other abnormalities of gait and mobility: Secondary | ICD-10-CM | POA: Diagnosis not present

## 2023-10-26 MED ORDER — CLOTRIMAZOLE 1 % VA CREA
1.0000 | TOPICAL_CREAM | Freq: Every day | VAGINAL | Status: DC
  Administered 2023-10-26 – 2023-11-01 (×6): 1 via VAGINAL
  Filled 2023-10-26: qty 45

## 2023-10-26 MED ORDER — CLOTRIMAZOLE 1 % VA CREA: 1.0000 | TOPICAL_CREAM | Freq: Every day | VAGINAL | Status: DC

## 2023-10-26 NOTE — ED Provider Notes (Signed)
 Foot by RN that while performing bed bath patient noted itchiness in her vaginal region.  Noted mild erythema.  I evaluated patient myself and chaperoned exam, does have mild erythema at the posterior aspect of her vagina and in her bilateral inguinal region most consistent with fungal infection.  No clinical concern for cellulitis and is nontender.  No masses.  Will start topical clotrimazole vaginal cream.   Collis Deaner, MD 10/26/23 1540

## 2023-10-26 NOTE — ED Notes (Signed)
 Pt has been calm and cooperative, she ate breakfast, lunch and dinner.  Bed bath and sheets changed earlier in the shift.  Pt reported itching in groin and EDP alerted,  peri care x2 in the afternoon.

## 2023-10-26 NOTE — ED Notes (Signed)
 Bed bath given, sheets changed, gown changed.  New purewick applied.  Pt is reporting some itching in groin, skin intact, ED notified.  Lunch tray warmed up and given to pt

## 2023-10-26 NOTE — ED Provider Notes (Signed)
-----------------------------------------   4:39 AM on 10/26/2023 -----------------------------------------   Blood pressure (!) 148/57, pulse 81, temperature 98.5 F (36.9 C), temperature source Oral, resp. rate 16, height 5\' 3"  (1.6 m), weight (!) 160 kg, SpO2 95%.  The patient is calm and cooperative at this time.  There have been no acute events since the last update.  Awaiting disposition plan from case management/social work.    Kyston Gonce, Clover Dao, DO 10/26/23 [phone]

## 2023-10-27 DIAGNOSIS — R2689 Other abnormalities of gait and mobility: Secondary | ICD-10-CM | POA: Diagnosis not present

## 2023-10-27 MED ORDER — DICLOFENAC SODIUM 1 % EX GEL
2.0000 g | Freq: Four times a day (QID) | CUTANEOUS | Status: DC | PRN
  Administered 2023-10-28 – 2023-11-01 (×5): 2 g via TOPICAL
  Filled 2023-10-27: qty 100

## 2023-10-27 NOTE — ED Notes (Signed)
 Pt placed on bedpan per request. Call light within reach.

## 2023-10-27 NOTE — ED Notes (Signed)
 Patient incontinent of urine. Pt cleaned. New pads on bed. Fresh warm blankets provided. No other needs at this time.

## 2023-10-27 NOTE — TOC Progression Note (Addendum)
 Transition of Care [facility]) - Progression Note    Patient Details  Name: MEGHNA HAGMANN MRN: [id number] Date of Birth: Sep 07, 1950  Transition of Care [facility]) CM/SW Contact  Zoe Hinds, RN Phone Number: 10/27/2023, 9:16 AM  Clinical Narrative:    This  CM attempted to reach admission liaisons at [facility]  were  referrals were sent [facility]  sent to [facility]. Both referrals are pending bed offer .TOC will cont to follow dc planning / care coordination and update as applicable.    Expected Discharge Plan:  (TBD) Barriers to Discharge: Unsafe home situation  Expected Discharge Plan and Services In-house Referral: Clinical Social Work Discharge Planning Services: CM Consult Post Acute Care Choice:  (TBD) Living arrangements for the past 2 months: Skilled Nursing Facility, Apartment                                       Social Determinants of Health (SDOH) Interventions SDOH Screenings   Food Insecurity: No Food Insecurity (11/11/2022)   Received from [facility]  Transportation Needs: No Transportation Needs (11/11/2022)   Received from [facility]  Utilities: Low Risk  (11/11/2022)   Received from [facility]  Financial Resource Strain: Low Risk  (11/11/2022)   Received from [facility]  Tobacco Use: Medium Risk (10/24/2023)  Health Literacy: Low Risk  (12/31/2021)   Received from [facility]    Readmission Risk Interventions     No data to display

## 2023-10-27 NOTE — ED Notes (Signed)
 Patient refuses to wear socks at this time.

## 2023-10-27 NOTE — ED Notes (Signed)
 Pt cleaned and new brief placed by this Clinical research associate and Alexa Andrews, NT. Pt appreciative.

## 2023-10-27 NOTE — ED Notes (Signed)
 Pharmacy contacted regarding pt needing PO labetalol  sent. Pharmacist stated they will send the medication soon.

## 2023-10-27 NOTE — Progress Notes (Signed)
 Occupational Therapy Treatment Patient Details Name: Angie Webb MRN: [id number] DOB: 29-Jan-1951 Today's Date: 10/27/2023   History of present illness Pt is a 73 y.o. female who presents after a fall approximately 3 months prior to arrival stating that she has had extreme difficulty getting around her house and performing her ADLs since that time. PCP told her to present to the emergency department for placement in an acute rehab facility. PMH of morbid obesity, depression/anxiety, CHF,  hep C, anxiety, osteoarthritis, hypertension, and chronic bilateral knee pain   OT comments  Pt is supine in bed on arrival. Pleasant and agreeable to PT/OT co-tx session to maximize pt/therapist safety. She reports her usual knee pain, but does increase after standing and reporting she felt it "pop." Pt performed Shaub sitting in bed with MOD I and donned bil socks with increased time and effort at supervision level. Supine to sit at EOB performed with supervision and pt progressed to being able to lateral scoot to [facility] with increased time, effort, intermittent rest breaks and SBA for safety. Pt agreeable to standing attempts with HHA, unable to rise on first attempt, but able to stand upright on 2nd trial with Max A x2 for ~10-15 second tolerance and anterior lean. Once returned to seated EOB, pt with fear of falling and noted posterior lean requiring to be assisted back to supine to prevent sliding. Max A x2 to manage trunk and BLEs onto bed, but pt able to roll and scoot up to [facility] using bed rails with MOD I/SUP. Provided red theraband and pt familiar with UB exercises. Pt returned to bed with all needs in place and will cont to require skilled acute OT services to maximize her safety and IND to return to PLOF.       If plan is discharge home, recommend the following:  Two people to help with walking and/or transfers;A lot of help with bathing/dressing/bathroom   Equipment Recommendations  Wheelchair cushion  (measurements OT);Wheelchair (measurements OT);Other (comment) (sliding board)    Recommendations for Other Services      Precautions / Restrictions Precautions Precautions: Fall Recall of Precautions/Restrictions: Intact Restrictions Weight Bearing Restrictions Per Provider Order: No       Mobility Bed Mobility Overal bed mobility: Needs Assistance Bed Mobility: Sit to Supine, Rolling, Supine to Sit Rolling: Used rails, Supervision   Supine to sit: Supervision, Used rails Sit to supine: HOB elevated, Used rails, +2 for physical assistance, Max assist   General bed mobility comments: able to reach EOB without diffivulty, but requires max A x2 to return to supine, able to scoot up in bed and roll with supervision using bed rails, bed in trendelenburg to maximize ease    Transfers Overall transfer level: Needs assistance Equipment used: 2 person hand held assist Transfers: Sit to/from Stand Sit to Stand: From elevated surface, +2 physical assistance, Max assist           General transfer comment: attmpted to stand twice, unsuccessful on 1st trial, elevated bed a little more for 2nd trial and put push with LUE off bed rail to clear buttocks and stand for ~10-15 seconds with HHA x2 until R knee "popped" per pt     Balance Overall balance assessment: Needs assistance Sitting-balance support: No upper extremity supported, Feet unsupported Sitting balance-Leahy Scale: Good Sitting balance - Comments: steady seated balance at EOB with feet supported, however with return to EOB after standing, posterior lean noted requiring to be returned to supine  Standing balance-Leahy Scale: Poor Standing balance comment: 2 person assist, anterior trunk lean                           ADL either performed or assessed with clinical judgement   ADL Overall ADL's : Needs assistance/impaired                     Lower Body Dressing: Sitting/lateral  leans;Supervision/safety Lower Body Dressing Details (indicate cue type and reason): via [place] sitting in bed, increased time/effort                    Extremity/Trunk Assessment              Vision       Perception     Praxis     Communication Communication Communication: No apparent difficulties   Cognition Arousal: Alert Behavior During Therapy: WFL for tasks assessed/performed                                 Following commands: Intact        Cueing   Cueing Techniques: Verbal cues  Exercises Other Exercises Other Exercises: Provided red theraband and edu on BUE strengthening exercises-pt with good carryover as she had been performing these at home.    Shoulder Instructions       General Comments HR and sp02 stable throughout session    Pertinent Vitals/ Pain       Pain Assessment Pain Assessment: Faces Faces Pain Scale: Hurts even more Pain Location: R knee Pain Descriptors / Indicators: Sore, Aching, Grimacing Pain Intervention(s): Limited activity within patient's tolerance, Monitored during session, Repositioned  Home Living                                          Prior Functioning/Environment              Frequency  Min 1X/week        Progress Toward Goals  OT Goals(current goals can now be found in the care plan section)  Progress towards OT goals: Progressing toward goals  Acute Rehab OT Goals Patient Stated Goal: be able to transfer to W/C OT Goal Formulation: With patient Time For Goal Achievement: 11/08/23 Potential to Achieve Goals: Fair  Plan      Co-evaluation    PT/OT/SLP Co-Evaluation/Treatment: Yes Reason for Co-Treatment: For patient/therapist safety;To address functional/ADL transfers PT goals addressed during session: Mobility/safety with mobility;Strengthening/ROM OT goals addressed during session: ADL's and self-care;Strengthening/ROM      AM-PAC OT "6 Clicks" Daily  Activity     Outcome Measure   Help from another person eating meals?: None Help from another person taking care of personal grooming?: A Little Help from another person toileting, which includes using toliet, bedpan, or urinal?: A Lot Help from another person bathing (including washing, rinsing, drying)?: A Lot Help from another person to put on and taking off regular upper body clothing?: A Little Help from another person to put on and taking off regular lower body clothing?: A Lot 6 Click Score: 16    End of Session Equipment Utilized During Treatment: Gait belt  OT Visit Diagnosis: Other abnormalities of gait and mobility (R26.89);Muscle weakness (generalized) (M62.81);History of falling (Z91.81)   Activity Tolerance Patient  tolerated treatment well   Patient Left in bed;with call bell/phone within reach;with bed alarm set   Nurse Communication Mobility status        Time: 4098-1191 OT Time Calculation (min): 24 min  Charges: OT General Charges $OT Visit: 1 Visit OT Treatments $Therapeutic Activity: 8-22 mins  Jordin Vicencio, OTR/L  10/27/23, 2:15 PM   Fatisha Rabalais E Lena Gores 10/27/2023, 2:11 PM

## 2023-10-27 NOTE — ED Provider Notes (Signed)
-----------------------------------------   5:39 AM on 10/27/2023 -----------------------------------------   Blood pressure (!) 163/55, pulse 90, temperature 98 F (36.7 C), temperature source Oral, resp. rate 18, height 5\' 3"  (1.6 m), weight (!) 160 kg, SpO2 96%.  The patient is calm and cooperative at this time.  There have been no acute events since the last update.  Awaiting disposition plan from Social Work team.   Eily Louvier J, MD 10/27/23 [phone]

## 2023-10-27 NOTE — Progress Notes (Signed)
 Physical Therapy Treatment Patient Details Name: Angie Webb MRN: [id number] DOB: 04/13/1951 Today's Date: 10/27/2023   History of Present Illness Pt is a 73 y.o. female who presents after a fall approximately 3 months prior to arrival stating that she has had extreme difficulty getting around her house and performing her ADLs since that time. PCP told her to present to the emergency department for placement in an acute rehab facility. PMH of morbid obesity, depression/anxiety, CHF,  hep C, anxiety, osteoarthritis, hypertension, and chronic bilateral knee pain    PT Comments  Pt received upright in bed agreeable to PT/OT co-treat for pt and therapist safety. Pt mobilizing well in bed able to don socks in [place] sitting albeit it seems effortful for pt. Supervision and bed rails to mobilize to EOB supine to sit. Regular seated rest breaks needed due to chronic SOB with minimal activity. Pt is able to laterally scoot towards HOB at SBA going to the L. Takes 2-3 seated rest breaks throughout and use of BUE support on single railing to assist. MaxA+2 from EOB to stand requiring 2 attempts for success with elevation in bed height. VC's for anterior trunk lean. Maintains standing ~10-15 seconds  with anterior trunk lean needing VC's for neutral, upright posture with poor carryover as pt is very anxious and has knee joint cavitation with pain leading to spontaneous return to sitting with heavy posterior lean requiring max to totalA+2 to transfer back to supine. With bed rails and features pt is able to scoot up in bed. Finished session with LE therex as listed below. Pt making fair progress towards goals but still a high falls risk and relies on heavy 2 person assist for minimal functional mobility OOB. D/c recs remain appropriate.    If plan is discharge home, recommend the following: Two people to help with walking and/or transfers;Assistance with cooking/housework;Assist for transportation;Help with stairs  or ramp for entrance;Two people to help with bathing/dressing/bathroom   Can travel by private vehicle     No  Equipment Recommendations  Other (comment) (TBD)    Recommendations for Other Services       Precautions / Restrictions Precautions Precautions: Fall Recall of Precautions/Restrictions: Intact Restrictions Weight Bearing Restrictions Per Provider Order: No     Mobility  Bed Mobility Overal bed mobility: Needs Assistance       Supine to sit: Supervision, Used rails Sit to supine: HOB elevated, Used rails, +2 for physical assistance, Max assist   General bed mobility comments: no physical suuport supine sit. maxA at LE's to return to supine. Able to boost independently up in bed in trendelenburg and using bed rails. Patient Response: Cooperative, Anxious  Transfers Overall transfer level: Needs assistance Equipment used: 2 person hand held assist Transfers: Sit to/from Stand Sit to Stand: From elevated surface, +2 physical assistance, Max assist           General transfer comment: x2 attempts. Able to stand today for ~10-15 seconds.    Ambulation/Gait                   Stairs             Wheelchair Mobility     Tilt Bed Tilt Bed Patient Response: Cooperative, Anxious  Modified Rankin (Stroke Patients Only)       Balance Overall balance assessment: Needs assistance Sitting-balance support: No upper extremity supported, Feet unsupported Sitting balance-Leahy Scale: Good       Standing balance-Leahy Scale: Poor Standing balance comment:  2 person assist, anterior trunk lean                            Communication Communication Communication: No apparent difficulties  Cognition Arousal: Alert Behavior During Therapy: WFL for tasks assessed/performed   PT - Cognitive impairments: No apparent impairments                         Following commands: Intact      Cueing Cueing Techniques: Verbal cues   Exercises General Exercises - Lower Extremity Ankle Circles/Pumps: AROM, Strengthening, Both, 10 reps, Supine Quad Sets: AROM, Strengthening, Right, Left, Both, 10 reps, Supine Gluteal Sets: AROM, Both, 10 reps, Supine Short Arc Quad: AROM, AAROM, Strengthening, Right, Left, 10 reps, Supine (AAROM on RLE, manual resistance to LLE)    General Comments General comments (skin integrity, edema, etc.): HR and sp02 stable throughout session      Pertinent Vitals/Pain Pain Assessment Pain Assessment: Faces Faces Pain Scale: Hurts even more Pain Location: R knee Pain Descriptors / Indicators: Sore, Aching, Grimacing Pain Intervention(s): Monitored during session, Limited activity within patient's tolerance    Home Living                          Prior Function            PT Goals (current goals can now be found in the care plan section) Acute Rehab PT Goals Patient Stated Goal: living independently even if w/c level in her home PT Goal Formulation: With patient Time For Goal Achievement: 11/08/23 Potential to Achieve Goals: Fair Progress towards PT goals: Progressing toward goals    Frequency    Min 2X/week      PT Plan      Co-evaluation PT/OT/SLP Co-Evaluation/Treatment: Yes Reason for Co-Treatment: For patient/therapist safety;To address functional/ADL transfers PT goals addressed during session: Mobility/safety with mobility;Strengthening/ROM OT goals addressed during session: ADL's and self-care;Strengthening/ROM      AM-PAC PT "6 Clicks" Mobility   Outcome Measure  Help needed turning from your back to your side while in a flat bed without using bedrails?: A Lot Help needed moving from lying on your back to sitting on the side of a flat bed without using bedrails?: A Lot Help needed moving to and from a bed to a chair (including a wheelchair)?: Total Help needed standing up from a chair using your arms (e.g., wheelchair or bedside chair)?: A  Lot Help needed to walk in hospital room?: Total Help needed climbing 3-5 steps with a railing? : Total 6 Click Score: 9    End of Session Equipment Utilized During Treatment: Gait belt Activity Tolerance: Patient limited by pain Patient left: in bed;with bed alarm set;with call bell/phone within reach Nurse Communication: Mobility status PT Visit Diagnosis: Other abnormalities of gait and mobility (R26.89);Muscle weakness (generalized) (M62.81);History of falling (Z91.81)     Time: 0981-1914 PT Time Calculation (min) (ACUTE ONLY): 23 min  Charges:    $Therapeutic Exercise: 8-22 mins PT General Charges $$ ACUTE PT VISIT: 1 Visit                    Marc Senior. Fairly IV, PT, DPT Physical Therapist- [place]  [facility]  10/27/2023, 2:26 PM

## 2023-10-27 NOTE — ED Notes (Signed)
 Patient hygiene performed at this time.  New pads placed and patient repositioned for comfort.

## 2023-10-28 DIAGNOSIS — R2689 Other abnormalities of gait and mobility: Secondary | ICD-10-CM | POA: Diagnosis not present

## 2023-10-28 NOTE — ED Notes (Signed)
 Hospital meal provided, pt tolerated w/o complaints.  Waste discarded appropriately.

## 2023-10-28 NOTE — ED Notes (Signed)
Pt cleaned up and purewick placed

## 2023-10-28 NOTE — ED Notes (Signed)
 This RN and ED tech Carolynne Citron provided pt with a complete bed change and fresh gown. Pure wick had leaked and pt had become wet. Pt pulled up in bed. Call bell within reach. No other needs at this time.

## 2023-10-28 NOTE — ED Notes (Signed)
 Pt cleaned and changed

## 2023-10-28 NOTE — ED Provider Notes (Signed)
-----------------------------------------   8:09 AM on 10/28/2023 -----------------------------------------   Blood pressure (!) 150/58, pulse 75, temperature 98.4 F (36.9 C), temperature source Oral, resp. rate 20, height 5\' 3"  (1.6 m), weight (!) 160 kg, SpO2 95%.  The patient is calm and cooperative at this time.  There have been no acute events since the last update.  Awaiting disposition plan from case management/social work.    Eiley Mcginnity, Clover Dao, DO 10/28/23 [phone]

## 2023-10-28 NOTE — TOC Progression Note (Signed)
 Transition of Care [facility]) - Progression Note    Patient Details  Name: Angie Webb MRN: [id number] Date of Birth: 1951-01-29  Transition of Care [facility]) CM/SW Contact  Crystie Yanko E Skye Rodarte, LCSW Phone Number: 10/28/2023, 9:41 AM  Clinical Narrative:    CSW called Tammy at [facility] and Bernardo Bridgeman at [facility] and requested they review the referral that was sent by [facility].   Expected Discharge Plan:  (TBD) Barriers to Discharge: Unsafe home situation  Expected Discharge Plan and Services In-house Referral: Clinical Social Work Discharge Planning Services: CM Consult Post Acute Care Choice:  (TBD) Living arrangements for the past 2 months: Skilled Nursing Facility, Apartment                                       Social Determinants of Health (SDOH) Interventions SDOH Screenings   Food Insecurity: No Food Insecurity (11/11/2022)   Received from [facility]  Transportation Needs: No Transportation Needs (11/11/2022)   Received from [facility]  Utilities: Low Risk  (11/11/2022)   Received from [facility]  Financial Resource Strain: Low Risk  (11/11/2022)   Received from [facility]  Tobacco Use: Medium Risk (10/24/2023)  Health Literacy: Low Risk  (12/31/2021)   Received from [facility]    Readmission Risk Interventions     No data to display

## 2023-10-29 DIAGNOSIS — R2689 Other abnormalities of gait and mobility: Secondary | ICD-10-CM | POA: Diagnosis not present

## 2023-10-29 NOTE — ED Notes (Signed)
 VOL/TOC Placement

## 2023-10-29 NOTE — ED Notes (Signed)
 Pt given warm/wet wash cloth to clean face/arms/etc.

## 2023-10-29 NOTE — ED Notes (Signed)
 Pt's pure wick leaked. Pt's brief changed and new pure wick placed.

## 2023-10-29 NOTE — ED Provider Notes (Signed)
-----------------------------------------   6:33 AM on 10/29/2023 -----------------------------------------   Blood pressure 131/60, pulse 89, temperature 98 F (36.7 C), temperature source Oral, resp. rate 18, height 1.6 m (5\' 3" ), weight (!) 160 kg, SpO2 95%.  The patient is calm and cooperative at this time.  There have been no acute events since the last update.  Awaiting disposition plan from [facility] team.   Lynnda Sas, MD 10/29/23 [phone]

## 2023-10-29 NOTE — ED Notes (Signed)
Pt cleaned and brief changed

## 2023-10-29 NOTE — ED Notes (Signed)
 Pt on bed pan. Cleaned up.

## 2023-10-30 DIAGNOSIS — R2689 Other abnormalities of gait and mobility: Secondary | ICD-10-CM | POA: Diagnosis not present

## 2023-10-30 NOTE — ED Notes (Signed)
vol/toc placement.. 

## 2023-10-30 NOTE — ED Notes (Signed)
Pt given lunch tray and beverage 

## 2023-10-30 NOTE — ED Notes (Signed)
 Pt assisted on and off bed pan.

## 2023-10-30 NOTE — ED Notes (Signed)
 Pharmacy was called and asked to tube down medications. They stated they that they will.   Pharmacy stated they did not have another applicator for pt's clotrimazole.   Pt was noted to be resting with even chest rise and fall piror to administration of other medications.

## 2023-10-30 NOTE — ED Notes (Signed)
 Patient pressed call light. This NT went into room. Patient requested a bath. This NT, [place] NT and [place] NT gave the patient a complete bed bath. Patient has clean linen on her bed, a clean brief, clean chux, a clean purewick and a clean gown. Patient is currently brushing her teeth at this time.

## 2023-10-30 NOTE — ED Provider Notes (Signed)
-----------------------------------------   3:45 AM on 10/30/2023 -----------------------------------------   Blood pressure (!) 153/63, pulse 89, temperature 98 F (36.7 C), temperature source Oral, resp. rate 18, height 1.6 m (5\' 3" ), weight (!) 160 kg, SpO2 95%.  The patient is calm and cooperative at this time.  There have been no acute events since the last update.  Awaiting disposition plan from [facility] team.   Lynnda Sas, MD 10/30/23 [phone]

## 2023-10-31 DIAGNOSIS — R2689 Other abnormalities of gait and mobility: Secondary | ICD-10-CM | POA: Diagnosis not present

## 2023-10-31 NOTE — TOC Progression Note (Addendum)
 Transition of Care [facility]) - Progression Note    Patient Details  Name: Angie Webb MRN: [id number] Date of Birth: 1951/05/26  Transition of Care [facility]) CM/SW Contact  Odilia Bennett, LCSW Phone Number: 10/31/2023, 8:43 AM  Clinical Narrative:  CSW left messages for [facility] and [facility] admissions coordinators asking them to review referral. Patient was at [facility] 4/23-5/5 (12 days).   8:52 am: [facility] is unable to offer a bed. [facility] is reviewing.  11:47 am: [facility] is verifying patient's benefits.  1:12 pm: [facility] is unable to offer a bed. CSW sent referral to other facilities in the area earlier today. [facility]hab and [facility] have declined. [facility] will review. Left message for [facility] admissions coordinator asking him to review referral. Patient has been updated.  2:41 pm: [facility] offered a bed but does not have availability at this time. [facility] declined. Extended search to surrounding counties.  3:46 pm: Reviewed bed offers with patient. She has chosen [facility]. CSW offered to update family but she said she would notify her healthcare agent. Admissions coordinator is aware. She will check to see if they have any bariatric beds in-house or if they will have to order one. CSW started insurance authorization.   4:18 pm: Received call from patient asking if we could wait to see if any facilities more local could take her. Explained that [facility] was the only local facility to express interest and they don't have availability right now. Discussed that she has been stable for discharge and insurance would not cover days just waiting on ideal placement. Patient expressed understanding.  Expected Discharge Plan:  (TBD) Barriers to Discharge: Unsafe home situation  Expected Discharge Plan and Services In-house Referral: Clinical Social  Work Discharge Planning Services: CM Consult Post Acute Care Choice:  (TBD) Living arrangements for the past 2 months: Skilled Nursing Facility, Apartment                                       Social Determinants of Health (SDOH) Interventions SDOH Screenings   Food Insecurity: No Food Insecurity (11/11/2022)   Received from [facility]  Transportation Needs: No Transportation Needs (11/11/2022)   Received from [facility]  Utilities: Low Risk  (11/11/2022)   Received from [facility]  Financial Resource Strain: Low Risk  (11/11/2022)   Received from [facility]  Tobacco Use: Medium Risk (10/24/2023)  Health Literacy: Low Risk  (12/31/2021)   Received from [facility]    Readmission Risk Interventions     No data to display

## 2023-10-31 NOTE — Progress Notes (Signed)
 Physical Therapy Treatment Patient Details Name: Angie Webb MRN: [id number] DOB: 1950-06-14 Today's Date: 10/31/2023   History of Present Illness Pt is a 73 y.o. female who presents after a fall approximately 3 months prior to arrival stating that she has had extreme difficulty getting around her house and performing her ADLs since that time. PCP told her to present to the emergency department for placement in an acute rehab facility. PMH of morbid obesity, depression/anxiety, CHF,  hep C, anxiety, osteoarthritis, hypertension, and chronic bilateral knee pain    PT Comments  Patient semi reclined in bed on arrival and agreeable to PT/OT co-tx session to maximize patient's tolerance and progression of mobility if able. Required supervision and use of rails for supine>sit. Able to laterally scoot ~1 foot to L on EOB with supervision but ineffective for OOB attempts this date. Attempted standing from slightly elevated bed surface and despite maxA+2, unable to come into full upright standing but able to clear buttocks x 3 from EOB.   Patient endorsing she was able to take shuffling steps 1 week prior to admission. Discussed transfer techniques (stand pivot vs transfer board) but unable to attempt stand pivot this date due to inability to come into standing due to R knee pain and potential fear of falling.   Discharge plan remains appropriate. Unsafe to discharge home without 24/7 assistance of 2 people at this point in her hospitalization.     If plan is discharge home, recommend the following: Two people to help with walking and/or transfers;Assistance with cooking/housework;Assist for transportation;Help with stairs or ramp for entrance;Two people to help with bathing/dressing/bathroom   Can travel by private vehicle     No  Equipment Recommendations  Other (comment) (TBD)    Recommendations for Other Services       Precautions / Restrictions Precautions Precautions: Fall Recall of  Precautions/Restrictions: Intact Restrictions Weight Bearing Restrictions Per Provider Order: No     Mobility  Bed Mobility Overal bed mobility: Needs Assistance Bed Mobility: Supine to Sit, Sit to Supine     Supine to sit: Supervision, Used rails Sit to supine: HOB elevated, Used rails, +2 for physical assistance, Max assist        Transfers Overall transfer level: Needs assistance Equipment used: 2 person hand held assist Transfers: Sit to/from Stand Sit to Stand: Max assist, +2 physical assistance, +2 safety/equipment, From elevated surface           General transfer comment: unable to come into upright standing despite maxA+2. Able to clear buttocks. Attempted x 3 from EOB in hopes to perform stand pivot transfer to bedside chair. Complaining of R knee pain with each attempt.    Ambulation/Gait                   Stairs             Wheelchair Mobility     Tilt Bed    Modified Rankin (Stroke Patients Only)       Balance Overall balance assessment: Needs assistance Sitting-balance support: No upper extremity supported, Feet unsupported Sitting balance-Leahy Scale: Good Sitting balance - Comments: steady seated balance at EOB with feet supported   Standing balance support: Bilateral upper extremity supported Standing balance-Leahy Scale: Poor                              Communication Communication Communication: No apparent difficulties  Cognition Arousal: Alert Behavior During Therapy:  WFL for tasks assessed/performed   PT - Cognitive impairments: No apparent impairments                         Following commands: Intact      Cueing Cueing Techniques: Verbal cues  Exercises      General Comments        Pertinent Vitals/Pain Pain Assessment Pain Assessment: Faces Faces Pain Scale: Hurts even more Pain Location: R knee Pain Descriptors / Indicators: Grimacing, Guarding Pain Intervention(s): Limited  activity within patient's tolerance, Monitored during session, Repositioned    Home Living                          Prior Function            PT Goals (current goals can now be found in the care plan section) Acute Rehab PT Goals Patient Stated Goal: living independently even if w/c level in her home PT Goal Formulation: With patient Time For Goal Achievement: 11/08/23 Potential to Achieve Goals: Fair Progress towards PT goals: Progressing toward goals    Frequency    Min 1X/week      PT Plan      Co-evaluation PT/OT/SLP Co-Evaluation/Treatment: Yes Reason for Co-Treatment: For patient/therapist safety;To address functional/ADL transfers PT goals addressed during session: Mobility/safety with mobility;Strengthening/ROM        AM-PAC PT "6 Clicks" Mobility   Outcome Measure  Help needed turning from your back to your side while in a flat bed without using bedrails?: A Lot Help needed moving from lying on your back to sitting on the side of a flat bed without using bedrails?: A Lot Help needed moving to and from a bed to a chair (including a wheelchair)?: Total Help needed standing up from a chair using your arms (e.g., wheelchair or bedside chair)?: Total Help needed to walk in hospital room?: Total Help needed climbing 3-5 steps with a railing? : Total 6 Click Score: 8    End of Session Equipment Utilized During Treatment: Gait belt Activity Tolerance: Patient limited by pain Patient left: in bed;with bed alarm set;with call bell/phone within reach Nurse Communication: Mobility status PT Visit Diagnosis: Other abnormalities of gait and mobility (R26.89);Muscle weakness (generalized) (M62.81);History of falling (Z91.81)     Time: 1478-2956 PT Time Calculation (min) (ACUTE ONLY): 38 min  Charges:    $Therapeutic Activity: 23-37 mins PT General Charges $$ ACUTE PT VISIT: 1 Visit                     Janine Melbourne, PT, DPT Physical Therapist -  [facility]  [address] [facility]    Obdulio Mash A Claramae Rigdon 10/31/2023, 11:01 AM

## 2023-10-31 NOTE — ED Notes (Signed)
 Pericare provided to pt. Pt's pads changed, and purewick replaced.

## 2023-10-31 NOTE — Progress Notes (Signed)
 Occupational Therapy Treatment Patient Details Name: Angie Webb MRN: [id number] DOB: 04/28/1951 Today's Date: 10/31/2023   History of present illness Pt is a 73 y.o. female who presents after a fall approximately 3 months prior to arrival stating that she has had extreme difficulty getting around her house and performing her ADLs since that time. PCP told her to present to the emergency department for placement in an acute rehab facility. PMH of morbid obesity, depression/anxiety, CHF,  hep C, anxiety, osteoarthritis, hypertension, and chronic bilateral knee pain   OT comments  Pt is supine in bed on arrival. Pleasant and agreeable to PT/OT co-treatment session to maximize her ability to progress mobility OOB. She continues to c/o R knee pain with all attempts at standing/weight bearing.  She is able to roll and reposition in bed, as well as perform supine to sit at EOB using bed rails with supervision. She is working on her lateral scoots and is able to progress to the left a few times with supervision, increased time and rest breaks. 3 STS attempts from EOB in hopes to progress to SPT, however is unable to reach full upright stand despite Max A x2, only able to minimally clear buttocks d/t c/o R knee pain. Pt notably upset stating at home just 1 week ago, she could stand with assist from her nephew and take small sliding steps to get to her recliner in the living room, however pt reports she was much stronger at that time. Discussed SPT vs sliding board transfers to progress her mobility and edu on trying to sit at EOB daily for meals to prevent further weakness. She will cont to require acute OT services to progress her strength and mobility to promote return to PLOF and decreased falls risk and caregiver burden.       If plan is discharge home, recommend the following:  Two people to help with walking and/or transfers;A lot of help with bathing/dressing/bathroom   Equipment  Recommendations  Wheelchair cushion (measurements OT);Wheelchair (measurements OT);Other (comment) (sliding board)    Recommendations for Other Services      Precautions / Restrictions Precautions Precautions: Fall Recall of Precautions/Restrictions: Intact Restrictions Weight Bearing Restrictions Per Provider Order: No       Mobility Bed Mobility Overal bed mobility: Needs Assistance Bed Mobility: Supine to Sit, Sit to Supine Rolling: Used rails, Supervision   Supine to sit: Supervision, Used rails Sit to supine: HOB elevated, Used rails, +2 for physical assistance, Max assist   General bed mobility comments: able to reach EOB without diffivulty, but requires max A x2 to return to supine, able to scoot up in bed and roll with supervision using bed rails, bed in trendelenburg to maximize ease    Transfers Overall transfer level: Needs assistance Equipment used: 2 person hand held assist Transfers: Sit to/from Stand Sit to Stand: Max assist, +2 physical assistance, +2 safety/equipment, From elevated surface           General transfer comment: pt only able to clear buttocks from EOB with Max A x2, unable to come to upright standing on 3 attempts from EOB in prep for transition to SPT, however pt c/o R knee pain limiting her ability to stand     Balance Overall balance assessment: Needs assistance Sitting-balance support: No upper extremity supported, Feet unsupported Sitting balance-Leahy Scale: Good Sitting balance - Comments: steady seated balance at EOB with feet supported   Standing balance support: Bilateral upper extremity supported Standing balance-Leahy Scale: Poor Standing  balance comment: 2 person max assist to clear buttocks                           ADL either performed or assessed with clinical judgement   ADL Overall ADL's : Needs assistance/impaired     Grooming: Wash/dry hands;Oral care;Wash/dry face;Bed level;Set up                  Lower Body Dressing Details (indicate cue type and reason): asked therapists to don socks this date d/t fatigue from being in bed over the Romey weekend                    Extremity/Trunk Assessment              Vision       Perception     Praxis     Communication Communication Communication: No apparent difficulties   Cognition Arousal: Alert Behavior During Therapy: WFL for tasks assessed/performed                                 Following commands: Intact        Cueing   Cueing Techniques: Verbal cues  Exercises Other Exercises Other Exercises: Edu to sit at EOB for meals with assist from nursing staff and to continue BUE and BLE HEPs    Shoulder Instructions       General Comments      Pertinent Vitals/ Pain       Pain Assessment Pain Assessment: Faces Faces Pain Scale: Hurts even more Pain Location: R knee Pain Descriptors / Indicators: Grimacing, Guarding Pain Intervention(s): Monitored during session, Repositioned, Limited activity within patient's tolerance  Home Living                                          Prior Functioning/Environment              Frequency  Min 1X/week        Progress Toward Goals  OT Goals(current goals can now be found in the care plan section)  Progress towards OT goals: Progressing toward goals  Acute Rehab OT Goals Patient Stated Goal: transfer to W/C OT Goal Formulation: With patient Time For Goal Achievement: 11/08/23 Potential to Achieve Goals: Fair  Plan      Co-evaluation    PT/OT/SLP Co-Evaluation/Treatment: Yes Reason for Co-Treatment: For patient/therapist safety;To address functional/ADL transfers PT goals addressed during session: Mobility/safety with mobility;Strengthening/ROM OT goals addressed during session: ADL's and self-care;Strengthening/ROM      AM-PAC OT "6 Clicks" Daily Activity     Outcome Measure   Help from another person  eating meals?: None Help from another person taking care of personal grooming?: A Little Help from another person toileting, which includes using toliet, bedpan, or urinal?: A Lot Help from another person bathing (including washing, rinsing, drying)?: A Lot Help from another person to put on and taking off regular upper body clothing?: A Little Help from another person to put on and taking off regular lower body clothing?: A Lot 6 Click Score: 16    End of Session Equipment Utilized During Treatment: Gait belt  OT Visit Diagnosis: Other abnormalities of gait and mobility (R26.89);Muscle weakness (generalized) (M62.81);History of falling (Z91.81)   Activity Tolerance Patient tolerated  treatment well   Patient Left in bed;with call bell/phone within reach;with bed alarm set   Nurse Communication Mobility status        Time: 1610-9604 OT Time Calculation (min): 39 min  Charges: OT General Charges $OT Visit: 1 Visit OT Treatments $Self Care/Home Management : 8-22 mins  Harwood Nall, OTR/L  10/31/23, 11:29 AM   Leonard Raker 10/31/2023, 11:24 AM

## 2023-11-01 DIAGNOSIS — R2689 Other abnormalities of gait and mobility: Secondary | ICD-10-CM | POA: Diagnosis not present

## 2023-11-01 NOTE — ED Notes (Signed)
 Pt is sleeping at this time and is in NAD.

## 2023-11-01 NOTE — ED Notes (Signed)
 Pt repositioned in the bed and breakfast served. Pt denies any other needs at this time.

## 2023-11-01 NOTE — TOC Progression Note (Signed)
 Transition of Care [facility]) - Progression Note    Patient Details  Name: Angie Webb MRN: [id number] Date of Birth: 1951/05/13  Transition of Care [facility]) CM/SW Contact  [place] L [place] [employer], [place] Phone Number: 11/01/2023, 1:57 PM  Clinical Narrative:    CSW spoke with Anthony Bateman, [employer]. Kia advised that she is on her way to see patient in the ED today. Pt can be accepted to her placement tomorrow per Kia.    Expected Discharge Plan:  (TBD) Barriers to Discharge: Unsafe home situation  Expected Discharge Plan and Services In-house Referral: Clinical Social Work Discharge Planning Services: CM Consult Post Acute Care Choice:  (TBD) Living arrangements for the past 2 months: Skilled Nursing Facility, Apartment                                       Social Determinants of Health (SDOH) Interventions SDOH Screenings   Food Insecurity: No Food Insecurity (11/11/2022)   Received [facility]  Transportation Needs: No Transportation Needs (11/11/2022)   Received from [facility]  Utilities: Low Risk  (11/11/2022)   Received from [facility]  Financial Resource Strain: Low Risk  (11/11/2022)   Received from [facility]  Tobacco Use: Medium Risk (10/24/2023)  Health Literacy: Low Risk  (12/31/2021)   Received from [facility]    Readmission Risk Interventions     No data to display

## 2023-11-01 NOTE — TOC Progression Note (Signed)
 Transition of Care [facility]) - Progression Note    Patient Details  Name: Angie Webb MRN: [id number] Date of Birth: Feb 04, 1951  Transition of Care [facility]) CM/SW Contact  [place] L Annaliyah Willig, [place] Phone Number: 11/01/2023, 3:23 PM  Clinical Narrative:     CSW received a call from Kia. Kia confirmed that she saw patient in the ED. Pt is good to be admitted to [facility] tomorrow.    Expected Discharge Plan:  (TBD) Barriers to Discharge: Unsafe home situation  Expected Discharge Plan and Services In-house Referral: Clinical Social Work Discharge Planning Services: CM Consult Post Acute Care Choice:  (TBD) Living arrangements for the past 2 months: Skilled Nursing Facility, Apartment                                       Social Determinants of Health (SDOH) Interventions SDOH Screenings   Food Insecurity: No Food Insecurity (11/11/2022)   Received from [facility]  Transportation Needs: No Transportation Needs (11/11/2022)   Received from [facility]  Utilities: Low Risk  (11/11/2022)   Received from [facility]  Financial Resource Strain: Low Risk  (11/11/2022)   Received from [facility]  Tobacco Use: Medium Risk (10/24/2023)  Health Literacy: Low Risk  (12/31/2021)   Received from [facility]    Readmission Risk Interventions     No data to display

## 2023-11-01 NOTE — Progress Notes (Signed)
 Occupational Therapy Treatment Patient Details Name: Angie Webb MRN: [id number] DOB: 07/29/1950 Today's Date: 11/01/2023   History of present illness Pt is a 73 y.o. female who presents after a fall approximately 3 months prior to arrival stating that she has had extreme difficulty getting around her house and performing her ADLs since that time. PCP told her to present to the emergency department for placement in an acute rehab facility. PMH of morbid obesity, depression/anxiety, CHF,  hep C, anxiety, osteoarthritis, hypertension, and chronic bilateral knee pain   OT comments  Pt seen for OT/PT treatment on this date. Upon arrival to room pt semi supine in bed, agreeable to tx. Pt requires supervision to get to EOB, MAXA +2 to return to supine (trunk/LE management). Pt completed bathing/grooming tasks while seated on the EOB with MINA for hard to reach areas (back, LEs), set up assistance required for the remainder of bathing/UB dressing. Pt STS from the EB with MAXA +2 utilizing a chair with the back facing towards the pt. Pt made progress in taking lateral steps up the head of bed with  bilateral UE supported and MAXA+2. Verbal encouragement and min verbal cues for safety required to complete mobility. Pt returned to bed to complete oral care with set up assistance. Pt making good progress toward goals, will continue to follow POC. Discharge recommendation remains appropriate.        If plan is discharge home, recommend the following:  Two people to help with walking and/or transfers;A lot of help with bathing/dressing/bathroom   Equipment Recommendations  Wheelchair cushion (measurements OT);Wheelchair (measurements OT);Other (comment)    Recommendations for Other Services      Precautions / Restrictions Precautions Precautions: Fall Recall of Precautions/Restrictions: Intact Restrictions Weight Bearing Restrictions Per Provider Order: No       Mobility Bed Mobility Overal  bed mobility: Needs Assistance Bed Mobility: Supine to Sit, Sit to Supine Rolling: Used rails, Supervision   Supine to sit: Supervision, Used rails Sit to supine: Max assist, +2 for physical assistance, Used rails (LE and trunk management)   General bed mobility comments: Pt able to assist in boost self up in bed with railings, demonstrated good functional use of bil UE    Transfers Overall transfer level: Needs assistance   Transfers: Sit to/from Stand Sit to Stand: Max assist, +2 physical assistance, From elevated surface           General transfer comment: Utilizing a chair with back facing towards patient to simulate safe transfers with use of UE strength to clear buttocks     Balance Overall balance assessment: Needs assistance Sitting-balance support: No upper extremity supported, Feet unsupported Sitting balance-Leahy Scale: Good Sitting balance - Comments: Steady reaching within BOS   Standing balance support: Bilateral upper extremity supported Standing balance-Leahy Scale: Poor Standing balance comment: 2 person max assist to clear buttocks                           ADL either performed or assessed with clinical judgement   ADL Overall ADL's : Needs assistance/impaired     Grooming: Oral care;Bed level;Applying deodorant;Sitting;Set up   Upper Body Bathing: Minimal assistance;Sitting   Lower Body Bathing: Sit to/from stand;Moderate assistance   Upper Body Dressing : Set up;Sitting   Lower Body Dressing: Minimal assistance;Bed level               Functional mobility during ADLs: Minimal assistance;+2 for physical assistance (Using  chair to assist in pt pulling self up from sitting) General ADL Comments: Set up assist required for all seated ADLs, MINA for LB bathing and back, oral care at bed level with set up assistance    Communication Communication Communication: No apparent difficulties   Cognition Arousal: Alert Behavior During  Therapy: WFL for tasks assessed/performed Cognition: No apparent impairments             OT - Cognition Comments: A/Ox4                 Following commands: Intact        Cueing   Cueing Techniques: Verbal cues  Exercises Exercises: Other exercises Other Exercises Other Exercises: Edu: Safe STS technique with back facing chair, ADL completion utilizing energy conservation techniques    Shoulder Instructions       General Comments HR elevates to 104bpm during bathing takes at bed level/sitting on EOB    Pertinent Vitals/ Pain       Pain Assessment Pain Assessment: Faces Faces Pain Scale: Hurts little more Pain Location: R knee, generalized pain Pain Descriptors / Indicators: Grimacing, Guarding Pain Intervention(s): Limited activity within patient's tolerance, Repositioned  Home Living                                          Prior Functioning/Environment              Frequency  Min 1X/week        Progress Toward Goals  OT Goals(current goals can now be found in the care plan section)  Progress towards OT goals: Progressing toward goals  Acute Rehab OT Goals Patient Stated Goal: transfer to W/C OT Goal Formulation: With patient Time For Goal Achievement: 11/08/23 Potential to Achieve Goals: Fair ADL Goals Pt Will Perform Lower Body Bathing: sitting/lateral leans;bed level;with set-up Pt Will Perform Lower Body Dressing: with supervision;sitting/lateral leans;bed level Pt Will Transfer to Toilet: with transfer board;bedside commode;regular height toilet;grab bars;with mod assist  Plan      Co-evaluation    PT/OT/SLP Co-Evaluation/Treatment: Yes Reason for Co-Treatment: For patient/therapist safety;To address functional/ADL transfers PT goals addressed during session: Mobility/safety with mobility;Strengthening/ROM OT goals addressed during session: ADL's and self-care;Strengthening/ROM      AM-PAC OT "6 Clicks" Daily  Activity     Outcome Measure   Help from another person eating meals?: None Help from another person taking care of personal grooming?: A Little Help from another person toileting, which includes using toliet, bedpan, or urinal?: A Lot Help from another person bathing (including washing, rinsing, drying)?: A Little Help from another person to put on and taking off regular upper body clothing?: A Little Help from another person to put on and taking off regular lower body clothing?: A Little 6 Click Score: 18    End of Session    OT Visit Diagnosis: Other abnormalities of gait and mobility (R26.89);Muscle weakness (generalized) (M62.81);History of falling (Z91.81)   Activity Tolerance Patient tolerated treatment well   Patient Left in bed;with call bell/phone within reach;with bed alarm set   Nurse Communication Mobility status;Other (comment) ([place] needs)        Time: 9562-1308 OT Time Calculation (min): 29 min  Charges: OT General Charges $OT Visit: 1 Visit OT Treatments $Self Care/Home Management : 8-22 mins  Rosaria Common M.S. OTR/L  11/01/23, 10:26 AM

## 2023-11-01 NOTE — ED Notes (Addendum)
 Physical therapy in with pt at this time. Pt bathing herself on the side of the bed.

## 2023-11-01 NOTE — ED Notes (Addendum)
 Pt using bedpan for bowel movement Pt had small BM.  Pericare performed.

## 2023-11-01 NOTE — ED Notes (Signed)
 Pt's bed was wet due to a pure wick malfunction. Pt's bed was changes, peri care provided, and new pure wick placed. Pt pulled up in bed. Call bell within reach. No other needs at this time.

## 2023-11-01 NOTE — ED Notes (Signed)
Pt resting at this time and denies any needs.

## 2023-11-01 NOTE — ED Notes (Signed)
 Threw pt's lunch tray away. Pt appreciative.

## 2023-11-01 NOTE — ED Notes (Signed)
Vol  TOC  placement

## 2023-11-01 NOTE — ED Provider Notes (Signed)
-----------------------------------------   4:54 AM on 11/01/2023 -----------------------------------------   Blood pressure (!) 129/59, pulse 79, temperature 98.3 F (36.8 C), temperature source Oral, resp. rate 18, height 1.6 m (5\' 3" ), weight (!) 160 kg, SpO2 95%.  The patient is calm and cooperative at this time.  There have been no acute events since the last update.  Awaiting disposition plan from [facility] team.   Lynnda Sas, MD 11/01/23 [phone]

## 2023-11-01 NOTE — TOC Progression Note (Signed)
 Transition of Care [facility]) - Progression Note    Patient Details  Name: Angie Webb MRN: [id number] Date of Birth: [birth year]  [facility]) CM/SW Contact  Odilia Bennett, LCSW Phone Number: 11/01/2023, 8:26 AM  Clinical Narrative:  Siegfried Dress still pending. [employer] requesting MD and RN notes. CSW uploaded into portal.   Expected Discharge Plan:  (TBD) Barriers to Discharge: Unsafe home situation  Expected Discharge Plan and Services In-house Referral: Clinical Social Work Discharge Planning Services: CM Consult Post Acute Care Choice:  (TBD) Living arrangements for the past 2 months: Skilled Nursing Facility, Apartment                                       Social Determinants of Health (SDOH) Interventions SDOH Screenings   Food Insecurity: No Food Insecurity (11/11/2022)   Received from [facility]  Transportation Needs: No Transportation Needs (11/11/2022)   Received from [facility]  Utilities: Low Risk  (11/11/2022)   Received from [facility]  Financial Resource Strain: Low Risk  (11/11/2022)   Received from [facility]  Tobacco Use: Medium Risk (10/24/2023)  Health Literacy: Low Risk  (12/31/2021)   Received from [facility], [facility]    Readmission Risk Interventions     No data to display

## 2023-11-01 NOTE — Progress Notes (Signed)
 Physical Therapy Treatment Patient Details Name: Angie Webb MRN: [id number] DOB: [birth year] Today's Date: 11/01/2023   History of Present Illness Pt is a 73 y.o. female who presents after a fall approximately 3 months prior to arrival stating that she has had extreme difficulty getting around her house and performing her ADLs since that time. PCP told her to present to the emergency department for placement in an acute rehab facility. PMH of morbid obesity, depression/anxiety, CHF,  hep C, anxiety, osteoarthritis, hypertension, and chronic bilateral knee pain    PT Comments  Patient supine in bed on arrival and agreeable to PT/OT treatment session. Requires supervision to get EOB. With use of back of chair facing patient, required maxA+2 to come into standing then was able to take a few shuffling steps towards HOB to L. Required maxA+2 to return to bed but able to scoot self up in bed with bed in trendelenburg and use of bedrails. Discharge plan remains appropriate.     If plan is discharge home, recommend the following: Two people to help with walking and/or transfers;Assistance with cooking/housework;Assist for transportation;Help with stairs or ramp for entrance;Two people to help with bathing/dressing/bathroom   Can travel by private vehicle     No  Equipment Recommendations  Other (comment) (TBD)    Recommendations for Other Services       Precautions / Restrictions Precautions Precautions: Fall Recall of Precautions/Restrictions: Intact Restrictions Weight Bearing Restrictions Per Provider Order: No     Mobility  Bed Mobility Overal bed mobility: Needs Assistance Bed Mobility: Supine to Sit, Sit to Supine Rolling: Used rails, Supervision   Supine to sit: Supervision, Used rails Sit to supine: Max assist, +2 for physical assistance, Used rails (LE and trunk management)   General bed mobility comments: Pt able to assist in boost self up in bed with railings,  demonstrated good functional use of bil UE    Transfers Overall transfer level: Needs assistance   Transfers: Sit to/from Stand Sit to Stand: Max assist, +2 physical assistance, From elevated surface           General transfer comment: Utilizing a chair with back facing towards patient to simulate safe transfers with use of UE strength to clear buttocks. Able to take a few shuffling steps towards L to [facility]    Ambulation/Gait                   Stairs             Wheelchair Mobility     Tilt Bed    Modified Rankin (Stroke Patients Only)       Balance Overall balance assessment: Needs assistance Sitting-balance support: No upper extremity supported, Feet unsupported Sitting balance-Leahy Scale: Good Sitting balance - Comments: Steady reaching within BOS   Standing balance support: Bilateral upper extremity supported Standing balance-Leahy Scale: Poor Standing balance comment: 2 person max assist to clear buttocks                            Communication Communication Communication: No apparent difficulties  Cognition Arousal: Alert Behavior During Therapy: WFL for tasks assessed/performed   PT - Cognitive impairments: No apparent impairments                         Following commands: Intact      Cueing Cueing Techniques: Verbal cues  Exercises      General Comments  General comments (skin integrity, edema, etc.): HR elevates to 104bpm during bathing takes at bed level/sitting on EOB      Pertinent Vitals/Pain Pain Assessment Pain Assessment: Faces Faces Pain Scale: Hurts little more Pain Location: R knee, generalized pain Pain Descriptors / Indicators: Grimacing, Guarding Pain Intervention(s): Monitored during session, Limited activity within patient's tolerance, Repositioned    Home Living                          Prior Function            PT Goals (current goals can now be found in the care plan  section) Acute Rehab PT Goals PT Goal Formulation: With patient Time For Goal Achievement: 11/08/23 Potential to Achieve Goals: Fair Progress towards PT goals: Progressing toward goals    Frequency    Min 1X/week      PT Plan      Co-evaluation PT/OT/SLP Co-Evaluation/Treatment: Yes Reason for Co-Treatment: For patient/therapist safety;To address functional/ADL transfers PT goals addressed during session: Mobility/safety with mobility;Strengthening/ROM OT goals addressed during session: ADL's and self-care;Strengthening/ROM      AM-PAC PT "6 Clicks" Mobility   Outcome Measure  Help needed turning from your back to your side while in a flat bed without using bedrails?: A Lot Help needed moving from lying on your back to sitting on the side of a flat bed without using bedrails?: A Lot Help needed moving to and from a bed to a chair (including a wheelchair)?: Total Help needed standing up from a chair using your arms (e.g., wheelchair or bedside chair)?: Total Help needed to walk in hospital room?: Total Help needed climbing 3-5 steps with a railing? : Total 6 Click Score: 8    End of Session   Activity Tolerance: Patient limited by pain Patient left: in bed;with bed alarm set;with call bell/phone within reach Nurse Communication: Mobility status PT Visit Diagnosis: Other abnormalities of gait and mobility (R26.89);Muscle weakness (generalized) (M62.81);History of falling (Z91.81)     Time: 1610-9604 PT Time Calculation (min) (ACUTE ONLY): 29 min  Charges:    $Therapeutic Activity: 8-22 mins PT General Charges $$ ACUTE PT VISIT: 1 Visit                     Janine Melbourne, PT, DPT Physical Therapist - [facility]  [facility]    Lorelle Macaluso A Elna Radovich 11/01/2023, 11:10 AM

## 2023-11-02 DIAGNOSIS — R262 Difficulty in walking, not elsewhere classified: Secondary | ICD-10-CM | POA: Diagnosis not present

## 2023-11-02 DIAGNOSIS — R2689 Other abnormalities of gait and mobility: Secondary | ICD-10-CM | POA: Diagnosis not present

## 2023-11-02 DIAGNOSIS — N183 Chronic kidney disease, stage 3 unspecified: Secondary | ICD-10-CM | POA: Diagnosis not present

## 2023-11-02 DIAGNOSIS — Z7401 Bed confinement status: Secondary | ICD-10-CM | POA: Diagnosis not present

## 2023-11-02 DIAGNOSIS — K0889 Other specified disorders of teeth and supporting structures: Secondary | ICD-10-CM | POA: Diagnosis not present

## 2023-11-02 DIAGNOSIS — G4733 Obstructive sleep apnea (adult) (pediatric): Secondary | ICD-10-CM | POA: Diagnosis not present

## 2023-11-02 DIAGNOSIS — N390 Urinary tract infection, site not specified: Secondary | ICD-10-CM | POA: Diagnosis not present

## 2023-11-02 DIAGNOSIS — R29898 Other symptoms and signs involving the musculoskeletal system: Secondary | ICD-10-CM | POA: Diagnosis not present

## 2023-11-02 DIAGNOSIS — R338 Other retention of urine: Secondary | ICD-10-CM | POA: Diagnosis not present

## 2023-11-02 DIAGNOSIS — I10 Essential (primary) hypertension: Secondary | ICD-10-CM | POA: Diagnosis not present

## 2023-11-02 DIAGNOSIS — D649 Anemia, unspecified: Secondary | ICD-10-CM | POA: Diagnosis not present

## 2023-11-02 DIAGNOSIS — I700 Atherosclerosis of aorta: Secondary | ICD-10-CM | POA: Diagnosis not present

## 2023-11-02 DIAGNOSIS — M150 Primary generalized (osteo)arthritis: Secondary | ICD-10-CM | POA: Diagnosis not present

## 2023-11-02 DIAGNOSIS — H409 Unspecified glaucoma: Secondary | ICD-10-CM | POA: Diagnosis not present

## 2023-11-02 DIAGNOSIS — B372 Candidiasis of skin and nail: Secondary | ICD-10-CM | POA: Diagnosis not present

## 2023-11-02 DIAGNOSIS — W19XXXA Unspecified fall, initial encounter: Secondary | ICD-10-CM | POA: Diagnosis not present

## 2023-11-02 DIAGNOSIS — G8929 Other chronic pain: Secondary | ICD-10-CM | POA: Diagnosis not present

## 2023-11-02 DIAGNOSIS — I110 Hypertensive heart disease with heart failure: Secondary | ICD-10-CM | POA: Diagnosis not present

## 2023-11-02 DIAGNOSIS — R531 Weakness: Secondary | ICD-10-CM | POA: Diagnosis not present

## 2023-11-02 DIAGNOSIS — R627 Adult failure to thrive: Secondary | ICD-10-CM | POA: Diagnosis not present

## 2023-11-02 DIAGNOSIS — I509 Heart failure, unspecified: Secondary | ICD-10-CM | POA: Diagnosis not present

## 2023-11-02 DIAGNOSIS — Z23 Encounter for immunization: Secondary | ICD-10-CM | POA: Diagnosis not present

## 2023-11-02 DIAGNOSIS — I5022 Chronic systolic (congestive) heart failure: Secondary | ICD-10-CM | POA: Diagnosis not present

## 2023-11-02 DIAGNOSIS — M6281 Muscle weakness (generalized): Secondary | ICD-10-CM | POA: Diagnosis not present

## 2023-11-02 DIAGNOSIS — E559 Vitamin D deficiency, unspecified: Secondary | ICD-10-CM | POA: Diagnosis not present

## 2023-11-02 DIAGNOSIS — J309 Allergic rhinitis, unspecified: Secondary | ICD-10-CM | POA: Diagnosis not present

## 2023-11-02 DIAGNOSIS — K219 Gastro-esophageal reflux disease without esophagitis: Secondary | ICD-10-CM | POA: Diagnosis not present

## 2023-11-02 DIAGNOSIS — I959 Hypotension, unspecified: Secondary | ICD-10-CM | POA: Diagnosis not present

## 2023-11-02 DIAGNOSIS — M170 Bilateral primary osteoarthritis of knee: Secondary | ICD-10-CM | POA: Diagnosis not present

## 2023-11-02 DIAGNOSIS — M1990 Unspecified osteoarthritis, unspecified site: Secondary | ICD-10-CM | POA: Diagnosis not present

## 2023-11-02 DIAGNOSIS — Z87898 Personal history of other specified conditions: Secondary | ICD-10-CM | POA: Diagnosis not present

## 2023-11-02 DIAGNOSIS — M6250 Muscle wasting and atrophy, not elsewhere classified, unspecified site: Secondary | ICD-10-CM | POA: Diagnosis not present

## 2023-11-02 NOTE — ED Notes (Signed)
 Patient called out using call bell system; patient was adjusted in bed and given a warm washcloth per her request.

## 2023-11-02 NOTE — TOC Transition Note (Signed)
 Transition of Care [facility]) - Discharge Note   Patient Details  Name: Angie Webb MRN: [id number] Date of Birth: 04-12-1951  [facility]) CM/SW Contact:  [place] L Kramer Hanrahan, LCSW Phone Number: 11/02/2023, 9:01 AM   Clinical Narrative:     CSW spoke with Kia at [facility]. Kia confirmed that pt can be transported to the facility by 1pm today. ED Nurse was notified and will setup transportation and an AVS will be entered.   Final next level of care:  (TBD) Barriers to Discharge: Unsafe home situation   Patient Goals and CMS Choice Patient states their goals for this hospitalization and ongoing recovery are:: To DC Home          Discharge Placement                       Discharge Plan and Services Additional resources added to the After Visit Summary for   In-house Referral: Clinical Social Work Discharge Planning Services: CM Consult Post Acute Care Choice:  (TBD)                               Social Drivers of Health (SDOH) Interventions SDOH Screenings   Food Insecurity: No Food Insecurity (11/11/2022)   Received from [facility]  Transportation Needs: No Transportation Needs (11/11/2022)   Received from [facility]  Utilities: Low Risk  (11/11/2022)   Received from [facility]  Financial Resource Strain: Low Risk  (11/11/2022)   Received from [facility]  Tobacco Use: Medium Risk (10/24/2023)  Health Literacy: Low Risk  (12/31/2021)   Received from [facility], [facility]     Readmission Risk Interventions     No data to display

## 2023-11-02 NOTE — ED Notes (Signed)
 Report given to [facility], Nurse Tammy

## 2023-11-02 NOTE — ED Provider Notes (Signed)
 Emergency Medicine Observation Re-evaluation Note  Angie Webb is a 73 y.o. female, seen on rounds today.  Pt initially presented to the ED for complaints of Extremity Weakness and SNF placement  Currently, the patient is resting comfortably.  Physical Exam  BP (!) 132/58   Pulse 83   Temp 97.9 F (36.6 C) (Oral)   Resp 18   Ht 5\' 3"  (1.6 m)   Wt (!) 160 kg   SpO2 95%   BMI 62.48 kg/m  General: No acute distress Cardiac: Well-perfused extremities Lungs: No respiratory distress Psych: Appropriate mood and affect  ED Course / MDM   EKG Interpretation Date/Time:  Tuesday Oct 24 2023 22:37:35 EDT Ventricular Rate:  88 PR Interval:  184 QRS Duration:  86 QT Interval:  356 QTC Calculation: 430 R Axis:   9  Text Interpretation: Sinus rhythm with Premature atrial complexes Low voltage QRS Borderline ECG When compared with ECG of 22-Sep-2023 10:26, Premature atrial complexes are now Present Minimal criteria for Anterior infarct are no longer Present Confirmed by UNCONFIRMED, DOCTOR ([id number]), editor Ara Bays [id number]) on 10/25/2023 6:41:43 AM  I have reviewed the labs performed to date as well as medications administered while in observation.  Recent changes in the last 24 hours include none.  Plan  Current plan is for placement.   Charleen Conn, MD 11/02/23 [phone]

## 2023-11-02 NOTE — ED Provider Notes (Signed)
-----------------------------------------   6:27 AM on 11/02/2023 -----------------------------------------   Blood pressure (!) 132/58, pulse 83, temperature 97.9 F (36.6 C), temperature source Oral, resp. rate 18, height 5\' 3"  (1.6 m), weight (!) 160 kg, SpO2 95%.  The patient is calm and cooperative at this time.  There have been no acute events since the last update.  Awaiting disposition plan from case management/social work.    Jarek Longton, Clover Dao, DO 11/02/23 [phone]

## 2023-11-02 NOTE — TOC Progression Note (Signed)
 Transition of Care [facility]) - Progression Note    Patient Details  Name: Angie Webb MRN: [id number] Date of Birth: 1951/04/15  Transition of [facility]) CM/SW Contact  [place] L Keylani Perlstein, [place] Phone Number: 11/02/2023, 10:46 AM  Clinical Narrative:     CSW received the pt room (119B) at [facility]. ED Nurse will call to report. Mauro Sox will schedule pt transport. AVS still needed from ED MD.   Expected Discharge Plan:  (TBD) Barriers to Discharge: Unsafe home situation  Expected Discharge Plan and Services In-house Referral: Clinical Social Work Discharge Planning Services: CM Consult Post Acute Care Choice:  (TBD) Living arrangements for the past 2 months: Skilled Nursing Facility, Apartment                                       Social Determinants of Health (SDOH) Interventions SDOH Screenings   Food Insecurity: No Food Insecurity (11/11/2022)   [facility]  Transportation Needs: No Transportation Needs (11/11/2022)   Received from [facility]  Utilities: Low Risk  (11/11/2022)   Received from [facility]  Financial Resource Strain: Low Risk  (11/11/2022)   Received from [facility]  Tobacco Use: Medium Risk (10/24/2023)  Health Literacy: Low Risk  (12/31/2021)   Received from [facility], [facility]    Readmission Risk Interventions     No data to display

## 2023-11-02 NOTE — ED Notes (Signed)
 [facility]  called  for  transport  to  [facility]   health  care

## 2023-11-03 DIAGNOSIS — I10 Essential (primary) hypertension: Secondary | ICD-10-CM | POA: Diagnosis not present

## 2023-11-03 DIAGNOSIS — I5022 Chronic systolic (congestive) heart failure: Secondary | ICD-10-CM | POA: Diagnosis not present

## 2023-11-03 DIAGNOSIS — R262 Difficulty in walking, not elsewhere classified: Secondary | ICD-10-CM | POA: Diagnosis not present

## 2023-11-03 DIAGNOSIS — R531 Weakness: Secondary | ICD-10-CM | POA: Diagnosis not present

## 2023-11-06 DIAGNOSIS — R338 Other retention of urine: Secondary | ICD-10-CM | POA: Diagnosis not present

## 2023-11-06 DIAGNOSIS — N390 Urinary tract infection, site not specified: Secondary | ICD-10-CM | POA: Diagnosis not present

## 2023-11-06 DIAGNOSIS — R531 Weakness: Secondary | ICD-10-CM | POA: Diagnosis not present

## 2023-11-07 DIAGNOSIS — M1990 Unspecified osteoarthritis, unspecified site: Secondary | ICD-10-CM | POA: Diagnosis not present

## 2023-11-07 DIAGNOSIS — R338 Other retention of urine: Secondary | ICD-10-CM | POA: Diagnosis not present

## 2023-11-07 DIAGNOSIS — N390 Urinary tract infection, site not specified: Secondary | ICD-10-CM | POA: Diagnosis not present

## 2023-11-07 DIAGNOSIS — R531 Weakness: Secondary | ICD-10-CM | POA: Diagnosis not present

## 2023-11-07 DIAGNOSIS — R262 Difficulty in walking, not elsewhere classified: Secondary | ICD-10-CM | POA: Diagnosis not present

## 2023-11-08 DIAGNOSIS — R531 Weakness: Secondary | ICD-10-CM | POA: Diagnosis not present

## 2023-11-08 DIAGNOSIS — D649 Anemia, unspecified: Secondary | ICD-10-CM | POA: Diagnosis not present

## 2023-11-08 DIAGNOSIS — W19XXXA Unspecified fall, initial encounter: Secondary | ICD-10-CM | POA: Diagnosis not present

## 2023-11-08 DIAGNOSIS — R262 Difficulty in walking, not elsewhere classified: Secondary | ICD-10-CM | POA: Diagnosis not present

## 2023-11-08 DIAGNOSIS — I10 Essential (primary) hypertension: Secondary | ICD-10-CM | POA: Diagnosis not present

## 2023-11-09 DIAGNOSIS — M150 Primary generalized (osteo)arthritis: Secondary | ICD-10-CM | POA: Diagnosis not present

## 2023-11-09 DIAGNOSIS — K219 Gastro-esophageal reflux disease without esophagitis: Secondary | ICD-10-CM | POA: Diagnosis not present

## 2023-11-09 DIAGNOSIS — I5022 Chronic systolic (congestive) heart failure: Secondary | ICD-10-CM | POA: Diagnosis not present

## 2023-11-11 DIAGNOSIS — R262 Difficulty in walking, not elsewhere classified: Secondary | ICD-10-CM | POA: Diagnosis not present

## 2023-11-11 DIAGNOSIS — K0889 Other specified disorders of teeth and supporting structures: Secondary | ICD-10-CM | POA: Diagnosis not present

## 2023-11-11 DIAGNOSIS — R531 Weakness: Secondary | ICD-10-CM | POA: Diagnosis not present

## 2023-11-11 DIAGNOSIS — Z87898 Personal history of other specified conditions: Secondary | ICD-10-CM | POA: Diagnosis not present

## 2023-11-13 NOTE — Progress Notes (Signed)
 [facility] Internal Medicine  CARE MANAGEMENT ENCOUNTER          Date of Service:  11/13/2023     Service:  Care Coordination - General Is there someone else in the room? No.  Are you located in [place]? Yes MyChart use by patient is active: no  Post-outreach Action Items: Provider: No/none. CM: No/none. Patient: Follow up with [facility] agency regarding care coordination.  Purpose of contact:    Care Manager (CM) received request to help find an external Home Health [facility]) agency for Physical Therapy, Occupational Therapy, and Nurse Aide services  CM completed the following related to the above request: Reviewed chart Patient lives in [facility] Patient has [employer] DUAL COMPLETE HMO and MEDICAID [place] ACCESS insurance  Contacted [facility] Liaison for [place], [place]: [phone]; [place]: [phone]) Referral accepted with a start of care date of 11/16/2023.  [facility] contact to pull referral and supporting documentation from Epic. No need to fax referral.

## 2023-11-16 DIAGNOSIS — I10 Essential (primary) hypertension: Secondary | ICD-10-CM | POA: Diagnosis not present

## 2023-11-16 DIAGNOSIS — R262 Difficulty in walking, not elsewhere classified: Secondary | ICD-10-CM | POA: Diagnosis not present

## 2023-11-16 DIAGNOSIS — G8929 Other chronic pain: Secondary | ICD-10-CM | POA: Diagnosis not present

## 2023-11-16 DIAGNOSIS — I5022 Chronic systolic (congestive) heart failure: Secondary | ICD-10-CM | POA: Diagnosis not present

## 2023-11-16 DIAGNOSIS — R531 Weakness: Secondary | ICD-10-CM | POA: Diagnosis not present

## 2023-11-16 DIAGNOSIS — D649 Anemia, unspecified: Secondary | ICD-10-CM | POA: Diagnosis not present

## 2023-11-23 NOTE — Progress Notes (Signed)
 [facility] Internal Medicine  CARE MANAGEMENT ENCOUNTER         Date of Service:  11/23/2023     Service:  Care Coordination - phone Is there someone else in the room? No.  MyChart use by patient is active: inconsistant  Post-outreach Action Items: Provider: pls consider order electric wheelchair for pt, face to face will be required for paperwork . CM: N/A. Patient: N/A.  Purpose of contact:    Care Manager (CM) received request for : pt voices she is needing an electric wheelchair and not the manual wheelchair that was ordered.     Care Manager (CM) completed the following related to the above request: Reviewed chart Patient lives in [place] Patient has [facility] dual complete and [employer] Manual wheelchair ordered on 10/24/23 as an ambulatory order to [facility]   [employer] and discussed the following   Advised pt CM has sent a message to MD with her request Advised pt that this can be a timely process , an evaluation will be needed  Pt voiced understanding.  Sent message to provider via secure inbasket stating pt is asking for electric wheelchair and if provider feels pt needs this DME item to review the DME smart phrase on how to order. After ordering advised provider to inform CM basket   Patient was encouraged to reach out to their provider with any questions or concerns.   A copy of this Patient Outreach Encounter was sent to patient's Primary Care Provider

## 2023-11-30 NOTE — Progress Notes (Signed)
 [facility] Internal Medicine  CARE MANAGEMENT ENCOUNTER         Date of Service:  11/30/2023     Service:  Care Coordination - phone Is there someone else in the room? No.  MyChart use by patient is active: no  Post-outreach Action Items: Provider: No/none. CM: No/none. Patient: Follow up with CM regarding power wheelchair status.  Purpose of contact:    Care Manager (CM) received request for Power Wheelchair status update.  Care Manager (CM) completed the following related to the above request: Reviewed chart Insurance: Passenger transport manager Complete HMO and  Medicaid Lives in [facility] Manual wheelchair ordered on 10/24/2023 as an ambulatory order to [facility]  CM attempted to contact [place] D. Watton via telephone. Left a message for a return call   A copy of this Patient Outreach Encounter was sent to patient's Primary Care Provider

## 2024-01-19 NOTE — Progress Notes (Signed)
 [facility] Internal Medicine  CARE MANAGEMENT ENCOUNTER         Date of Service:  01/19/2024     Service:  Care Coordination - phone Is there someone else in the room? No.  MyChart use by patient is active: no  Post-outreach Action Items: Provider: No/none. CM: No/none. Patient: Return call to clinic.  Purpose of contact:    Care Manager (CM) received a message from pt inquiring about the status of her PCS referral and her request for an electric wheelchair.  Care Manager (CM) completed the following related to the above request: Reviewed chart Pt lives in [facility] [employer] and [place] 10/18/23 CM Outreach note: PCS form prepped for MD signature per pt request. 10/26/23 PCS referral form faxed to [place] Between mid-May through June pt called several times to inquire about wheelchair. Manual chair ordered and delivered to pt by [facility] 11/23/23: CM Outreach note: Pt calls clinic on 6/11 & reports needing electric wheelchair.  Face-to-Face visit required for electric wheelchair order to be fulfilled. Identified resources [place] [employer]: Phone [phone]; Fax [phone] CM contacted [place] [employer] regarding status of PCS referral that was faxed on 10/26/23. Per Michaele at [facility], pt's referral was received and an assessment was scheduled. However, when assessor called patient to confirm the appointment, pt refused assessment. Contacted Lidie D. Dannemiller via phone. No answer. Voicemail message left requesting callback.  Patient was encouraged to reach out to their provider with any questions or concerns.  A copy of this Patient Outreach Encounter was sent to patient's Primary Care Provider

## 2024-01-24 NOTE — Telephone Encounter (Signed)
 Refilled per protocol through 09/19/2024 (date of last visit)

## 2024-01-27 ENCOUNTER — Emergency Department

## 2024-01-27 ENCOUNTER — Other Ambulatory Visit: Payer: Self-pay

## 2024-01-27 ENCOUNTER — Encounter: Payer: Self-pay | Admitting: *Deleted

## 2024-01-27 ENCOUNTER — Observation Stay
Admission: EM | Admit: 2024-01-27 | Discharge: 2024-01-28 | Disposition: A | Discharge status: Home / Self Care | Attending: Internal Medicine | Admitting: Internal Medicine

## 2024-01-27 DIAGNOSIS — I110 Hypertensive heart disease with heart failure: Secondary | ICD-10-CM | POA: Insufficient documentation

## 2024-01-27 DIAGNOSIS — R7989 Other specified abnormal findings of blood chemistry: Secondary | ICD-10-CM

## 2024-01-27 DIAGNOSIS — Z7901 Long term (current) use of anticoagulants: Secondary | ICD-10-CM | POA: Diagnosis not present

## 2024-01-27 DIAGNOSIS — R519 Headache, unspecified: Principal | ICD-10-CM

## 2024-01-27 DIAGNOSIS — Z79899 Other long term (current) drug therapy: Secondary | ICD-10-CM | POA: Insufficient documentation

## 2024-01-27 DIAGNOSIS — I160 Hypertensive urgency: Principal | ICD-10-CM | POA: Diagnosis present

## 2024-01-27 DIAGNOSIS — I5032 Chronic diastolic (congestive) heart failure: Secondary | ICD-10-CM | POA: Diagnosis not present

## 2024-01-27 DIAGNOSIS — Z87891 Personal history of nicotine dependence: Secondary | ICD-10-CM | POA: Insufficient documentation

## 2024-01-27 DIAGNOSIS — E876 Hypokalemia: Secondary | ICD-10-CM | POA: Diagnosis not present

## 2024-01-27 DIAGNOSIS — D649 Anemia, unspecified: Secondary | ICD-10-CM | POA: Insufficient documentation

## 2024-01-27 DIAGNOSIS — I10 Essential (primary) hypertension: Secondary | ICD-10-CM | POA: Diagnosis present

## 2024-01-27 DIAGNOSIS — I169 Hypertensive crisis, unspecified: Secondary | ICD-10-CM

## 2024-01-27 LAB — CBC
HCT: 40.9 % (ref 36.0–46.0)
Hemoglobin: 12.9 g/dL (ref 12.0–15.0)
MCH: 28.2 pg (ref 26.0–34.0)
MCHC: 31.5 g/dL (ref 30.0–36.0)
MCV: 89.3 fL (ref 80.0–100.0)
Platelets: 354 K/uL (ref 150–400)
RBC: 4.58 MIL/uL (ref 3.87–5.11)
RDW: 15.6 % — ABNORMAL HIGH (ref 11.5–15.5)
WBC: 7.8 K/uL (ref 4.0–10.5)
nRBC: 0 % (ref 0.0–0.2)

## 2024-01-27 LAB — BASIC METABOLIC PANEL WITH GFR
Anion gap: 13 (ref 5–15)
BUN: 11 mg/dL (ref 8–23)
CO2: 29 mmol/L (ref 22–32)
Calcium: 9 mg/dL (ref 8.9–10.3)
Chloride: 97 mmol/L — ABNORMAL LOW (ref 98–111)
Creatinine, Ser: 0.91 mg/dL (ref 0.44–1.00)
GFR, Estimated: >60 mL/min (ref >60)
Glucose, Bld: 111 mg/dL — ABNORMAL HIGH (ref 70–99)
Potassium: 3.2 mmol/L — ABNORMAL LOW (ref 3.5–5.1)
Sodium: 139 mmol/L (ref 135–145)

## 2024-01-27 LAB — TROPONIN I (HIGH SENSITIVITY): Troponin I (High Sensitivity): 19 ng/L — ABNORMAL HIGH (ref <18)

## 2024-01-27 MED ORDER — LABETALOL HCL 100 MG PO TABS
100.0000 mg | ORAL_TABLET | Freq: Once | ORAL | Status: AC
  Administered 2024-01-27: 100 mg via ORAL
  Filled 2024-01-27: qty 1

## 2024-01-27 MED ORDER — LISINOPRIL 10 MG PO TABS
20.0000 mg | ORAL_TABLET | Freq: Once | ORAL | Status: AC
  Administered 2024-01-27: 20 mg via ORAL
  Filled 2024-01-27: qty 2

## 2024-01-27 MED ORDER — METOCLOPRAMIDE HCL 5 MG/ML IJ SOLN
10.0000 mg | Freq: Once | INTRAMUSCULAR | Status: AC
  Administered 2024-01-27: 10 mg via INTRAMUSCULAR
  Filled 2024-01-27: qty 2

## 2024-01-27 NOTE — ED Triage Notes (Signed)
 Pt arrives by [facility] from home due to headache which began this am and is felt in the top of her head.  She states that it feels throbbing and the acetaminophen  she took did not alleviate the pain.  Pt has hx of HTN and had been taking labetolol and lisinopril  as prescribed.  No visual changes, no neuro deficits.  Pt is alert and oriented.  Pt reports that she weighs 350lbs, BP measurement in her forearm.

## 2024-01-27 NOTE — ED Triage Notes (Signed)
 First Nurse Note;  Pt via [facility] from home. Pt c/o headache that started this morning. Reports OTC medications has not help. Pt has a hx of HTN. Denies NV. Denies vision changes. Pt is A&Ox4 and NAD 87 HR  111 CBG  98% on RA 235/135 BP

## 2024-01-27 NOTE — ED Provider Notes (Signed)
 [facility] Provider Note    Event Date/Time   First MD Initiated Contact with Patient 01/27/24 2004     (approximate)   History   Headache and Hypertension   HPI  Angie Webb is a 73 y.o. female with a history of obesity, CHF, hypertension, chronic knee pain, depression, and anxiety who presents with headache since yesterday, gradual onset, mainly on the right side of her head, throbbing in quality, and associated with photophobia and some nausea.  The patient states that she has not had a headache like this before.  She denies any head trauma.  She denies any vision changes.  She noted that her blood pressure was elevated at home which concerned her.  She states that she has been compliant with her blood pressure medications although has not taken her evening doses.  She denies any chest pain or difficulty breathing.  I reviewed the past medical records.  The patient's most recent outpatient encounters over the last 2 months were primarily with case management.  She was seen in the ED in May with weakness after a fall.  She has no recent hospitalizations.   Physical Exam   Triage Vital Signs: ED Triage Vitals [01/27/24 1849]  Encounter Vitals Group     BP (!) 200/95     Girls Systolic BP Percentile      Girls Diastolic BP Percentile      Boys Systolic BP Percentile      Boys Diastolic BP Percentile      Pulse Rate 96     Resp 16     Temp 98 F (36.7 C)     Temp Source Oral     SpO2 97 %     Weight      Height      Head Circumference      Peak Flow      Pain Score      Pain Loc      Pain Education      Exclude from Growth Chart     Most recent vital signs: Vitals:   01/27/24 2208 01/27/24 2315  BP:  (!) 158/80  Pulse: 94 85  Resp:    Temp:    SpO2: 100% 96%     General: Alert, relatively well-appearing, no distress.  CV:  Good peripheral perfusion.  Resp:  Normal effort.  Abd:  No distention.  Other:  EOMI.  PERRLA.  Mild  photophobia.  No facial droop.  Normal speech.  No pronator drift.  No ataxia.  Motor and sensory intact in all extremities.   ED Results / Procedures / Treatments   Labs (all labs ordered are listed, but only abnormal results are displayed) Labs Reviewed  BASIC METABOLIC PANEL WITH GFR - Abnormal; Notable for the following components:      Result Value   Potassium 3.2 (*)    Chloride 97 (*)    Glucose, Bld 111 (*)    All other components within normal limits  CBC - Abnormal; Notable for the following components:   RDW 15.6 (*)    All other components within normal limits  TROPONIN I (HIGH SENSITIVITY) - Abnormal; Notable for the following components:   Troponin I (High Sensitivity) 19 (*)    All other components within normal limits  TROPONIN I (HIGH SENSITIVITY)     EKG  ED ECG REPORT I, Waylon Cassis, the attending physician, personally viewed and interpreted this ECG.  Date: 01/27/2024 EKG Time: 1903  Rate: 85 Rhythm: normal sinus rhythm QRS Axis: normal Intervals: normal ST/T Wave abnormalities: normal Narrative Interpretation: no evidence of acute ischemia    RADIOLOGY  CT head: I independently viewed and interpreted the images; there is no ICH.  Radiology report indicates no acute abnormality.  Chest x-ray: No focal consolidation or edema   PROCEDURES:  Critical Care performed: No  Procedures   MEDICATIONS ORDERED IN ED: Medications  metoCLOPramide  (REGLAN ) injection 10 mg (10 mg Intramuscular Given 01/27/24 2148)  labetalol  (NORMODYNE ) tablet 100 mg (100 mg Oral Given 01/27/24 2152)  lisinopril  (ZESTRIL ) tablet 20 mg (20 mg Oral Given 01/27/24 2147)     IMPRESSION / MDM / ASSESSMENT AND PLAN / ED COURSE  I reviewed the triage vital signs and the nursing notes.  72 year old female with PMH as noted above presents with headache since yesterday along with elevated blood pressure.  Physical exam is unremarkable except for the blood pressure.   Neurologic exam is nonfocal.  Differential diagnosis includes, but is not limited to, migraine headache, tension, other headache syndrome, hypertensive crisis.  However, I suspect that the hypertension is most likely secondary to the acute pain rather than the cause of the headache.  CT head and chest x-ray are negative.  We will obtain labs, give Reglan  for the headache, give the patient her evening doses of labetalol  and lisinopril , and reassess.  Patient's presentation is most consistent with acute complicated illness / injury requiring diagnostic workup.  The patient is on the cardiac monitor to evaluate for evidence of arrhythmia and/or significant heart rate changes.  ----------------------------------------- 11:36 PM on 01/27/2024 -----------------------------------------  The patient's headache has significantly improved.  The blood pressure is also improved.  BMP and CBC show no acute findings.  Initial troponin is minimally elevated, likely due to her hypertension.  We will obtain a second troponin.  I anticipate if this is not further elevated, she will be appropriate for discharge home.  I have signed her out to the oncoming ED physician Dr. Cyrena.   FINAL CLINICAL IMPRESSION(S) / ED DIAGNOSES   Final diagnoses:  Acute nonintractable headache, unspecified headache type  Hypertension, unspecified type     Rx / DC Orders   ED Discharge Orders     None        Note:  This document was prepared using Dragon voice recognition software and may include unintentional dictation errors.    Jacolyn Pae, MD 01/27/24 2337

## 2024-01-28 DIAGNOSIS — I5032 Chronic diastolic (congestive) heart failure: Secondary | ICD-10-CM | POA: Insufficient documentation

## 2024-01-28 DIAGNOSIS — I10 Essential (primary) hypertension: Secondary | ICD-10-CM

## 2024-01-28 DIAGNOSIS — E876 Hypokalemia: Secondary | ICD-10-CM | POA: Diagnosis not present

## 2024-01-28 DIAGNOSIS — I160 Hypertensive urgency: Secondary | ICD-10-CM | POA: Diagnosis not present

## 2024-01-28 DIAGNOSIS — D649 Anemia, unspecified: Secondary | ICD-10-CM | POA: Diagnosis not present

## 2024-01-28 DIAGNOSIS — R519 Headache, unspecified: Secondary | ICD-10-CM

## 2024-01-28 DIAGNOSIS — R7989 Other specified abnormal findings of blood chemistry: Secondary | ICD-10-CM

## 2024-01-28 LAB — MAGNESIUM: Magnesium: 1.2 mg/dL — ABNORMAL LOW (ref 1.7–2.4)

## 2024-01-28 LAB — CBC
HCT: 36.9 % (ref 36.0–46.0)
Hemoglobin: 11.9 g/dL — ABNORMAL LOW (ref 12.0–15.0)
MCH: 28.9 pg (ref 26.0–34.0)
MCHC: 32.2 g/dL (ref 30.0–36.0)
MCV: 89.6 fL (ref 80.0–100.0)
Platelets: 290 K/uL (ref 150–400)
RBC: 4.12 MIL/uL (ref 3.87–5.11)
RDW: 15.6 % — ABNORMAL HIGH (ref 11.5–15.5)
WBC: 6.1 K/uL (ref 4.0–10.5)
nRBC: 0 % (ref 0.0–0.2)

## 2024-01-28 LAB — BASIC METABOLIC PANEL WITH GFR
Anion gap: 13 (ref 5–15)
BUN: 12 mg/dL (ref 8–23)
CO2: 26 mmol/L (ref 22–32)
Calcium: 8.3 mg/dL — ABNORMAL LOW (ref 8.9–10.3)
Chloride: 99 mmol/L (ref 98–111)
Creatinine, Ser: 0.97 mg/dL (ref 0.44–1.00)
GFR, Estimated: >60 mL/min (ref >60)
Glucose, Bld: 149 mg/dL — ABNORMAL HIGH (ref 70–99)
Potassium: 2.8 mmol/L — ABNORMAL LOW (ref 3.5–5.1)
Sodium: 138 mmol/L (ref 135–145)

## 2024-01-28 LAB — TROPONIN I (HIGH SENSITIVITY)
Troponin I (High Sensitivity): 18 ng/L — ABNORMAL HIGH (ref <18)
Troponin I (High Sensitivity): 13 ng/L (ref <18)

## 2024-01-28 LAB — HIV ANTIBODY (ROUTINE TESTING W REFLEX): HIV Screen 4th Generation wRfx: NONREACTIVE

## 2024-01-28 MED ORDER — ENOXAPARIN SODIUM 40 MG/0.4ML IJ SOSY
40.0000 mg | PREFILLED_SYRINGE | INTRAMUSCULAR | Status: DC
  Administered 2024-01-28: 40 mg via SUBCUTANEOUS
  Filled 2024-01-28: qty 0.4

## 2024-01-28 MED ORDER — LABETALOL HCL 5 MG/ML IV SOLN
20.0000 mg | Freq: Once | INTRAVENOUS | Status: AC
  Administered 2024-01-28: 20 mg via INTRAVENOUS
  Filled 2024-01-28: qty 4

## 2024-01-28 MED ORDER — LABETALOL HCL 5 MG/ML IV SOLN: 20.0000 mg | INTRAVENOUS | Status: DC | PRN

## 2024-01-28 MED ORDER — SODIUM CHLORIDE 0.9 % IV SOLN: INTRAVENOUS | Status: DC

## 2024-01-28 MED ORDER — VITAMIN D 25 MCG (1000 UNIT) PO TABS
1000.0000 [IU] | ORAL_TABLET | Freq: Every day | ORAL | Status: DC
  Administered 2024-01-28: 1000 [IU] via ORAL
  Filled 2024-01-28: qty 1

## 2024-01-28 MED ORDER — LORATADINE 10 MG PO TABS
10.0000 mg | ORAL_TABLET | Freq: Every day | ORAL | Status: DC
  Administered 2024-01-28: 10 mg via ORAL
  Filled 2024-01-28: qty 1

## 2024-01-28 MED ORDER — HYDRALAZINE HCL 20 MG/ML IJ SOLN: 10.0000 mg | Freq: Four times a day (QID) | INTRAMUSCULAR | Status: DC | PRN

## 2024-01-28 MED ORDER — ACETAMINOPHEN 500 MG PO TABS
1000.0000 mg | ORAL_TABLET | Freq: Once | ORAL | Status: AC
  Administered 2024-01-28: 1000 mg via ORAL
  Filled 2024-01-28: qty 2

## 2024-01-28 MED ORDER — TRAZODONE HCL 50 MG PO TABS: 25.0000 mg | ORAL_TABLET | Freq: Every evening | ORAL | Status: DC | PRN

## 2024-01-28 MED ORDER — POTASSIUM CHLORIDE 20 MEQ PO PACK
40.0000 meq | PACK | Freq: Once | ORAL | Status: AC
  Administered 2024-01-28: 40 meq via ORAL
  Filled 2024-01-28: qty 2

## 2024-01-28 MED ORDER — MORPHINE SULFATE (PF) 4 MG/ML IV SOLN
4.0000 mg | Freq: Once | INTRAVENOUS | Status: AC
  Administered 2024-01-28: 4 mg via INTRAVENOUS
  Filled 2024-01-28: qty 1

## 2024-01-28 MED ORDER — LABETALOL HCL 5 MG/ML IV SOLN: 20.0000 mg | Freq: Once | INTRAVENOUS | Status: DC

## 2024-01-28 MED ORDER — POTASSIUM CHLORIDE CRYS ER 20 MEQ PO TBCR
40.0000 meq | EXTENDED_RELEASE_TABLET | ORAL | Status: AC
  Administered 2024-01-28 (×2): 40 meq via ORAL
  Filled 2024-01-28 (×2): qty 2

## 2024-01-28 MED ORDER — ACETAMINOPHEN 325 MG RE SUPP: 650.0000 mg | Freq: Four times a day (QID) | RECTAL | Status: DC | PRN

## 2024-01-28 MED ORDER — HYDRALAZINE HCL 20 MG/ML IJ SOLN
10.0000 mg | Freq: Four times a day (QID) | INTRAMUSCULAR | Status: DC | PRN
  Administered 2024-01-28: 10 mg via INTRAVENOUS
  Filled 2024-01-28: qty 1

## 2024-01-28 MED ORDER — ONDANSETRON HCL 4 MG PO TABS: 4.0000 mg | ORAL_TABLET | Freq: Four times a day (QID) | ORAL | Status: DC | PRN

## 2024-01-28 MED ORDER — AMLODIPINE BESYLATE 10 MG PO TABS: 10.0000 mg | ORAL_TABLET | Freq: Every day | ORAL | 2 refills | Status: AC

## 2024-01-28 MED ORDER — PANTOPRAZOLE SODIUM 40 MG PO TBEC
40.0000 mg | DELAYED_RELEASE_TABLET | Freq: Every day | ORAL | Status: DC
  Administered 2024-01-28: 40 mg via ORAL
  Filled 2024-01-28: qty 1

## 2024-01-28 MED ORDER — HYDROCHLOROTHIAZIDE 25 MG PO TABS
25.0000 mg | ORAL_TABLET | ORAL | Status: DC
Start: 2024-01-29 — End: 2024-01-28

## 2024-01-28 MED ORDER — DIPHENHYDRAMINE HCL 50 MG/ML IJ SOLN
25.0000 mg | Freq: Once | INTRAMUSCULAR | Status: AC
  Administered 2024-01-28: 25 mg via INTRAVENOUS
  Filled 2024-01-28: qty 1

## 2024-01-28 MED ORDER — TORSEMIDE 20 MG PO TABS
20.0000 mg | ORAL_TABLET | ORAL | Status: DC
  Administered 2024-01-28: 20 mg via ORAL
  Filled 2024-01-28: qty 1

## 2024-01-28 MED ORDER — LABETALOL HCL 200 MG PO TABS
200.0000 mg | ORAL_TABLET | Freq: Once | ORAL | Status: DC
  Filled 2024-01-28: qty 1

## 2024-01-28 MED ORDER — POTASSIUM CHLORIDE CRYS ER 20 MEQ PO TBCR: 20.0000 meq | EXTENDED_RELEASE_TABLET | Freq: Every day | ORAL | 0 refills | Status: AC

## 2024-01-28 MED ORDER — DORZOLAMIDE HCL-TIMOLOL MAL 2-0.5 % OP SOLN
1.0000 [drp] | Freq: Two times a day (BID) | OPHTHALMIC | Status: DC
  Administered 2024-01-28 (×2): 1 [drp] via OPHTHALMIC
  Filled 2024-01-28: qty 10

## 2024-01-28 MED ORDER — LABETALOL HCL 100 MG PO TABS
100.0000 mg | ORAL_TABLET | Freq: Two times a day (BID) | ORAL | Status: DC
  Administered 2024-01-28: 100 mg via ORAL
  Filled 2024-01-28 (×2): qty 1

## 2024-01-28 MED ORDER — LATANOPROST 0.005 % OP SOLN
1.0000 [drp] | Freq: Every day | OPHTHALMIC | Status: DC
  Filled 2024-01-28: qty 2.5

## 2024-01-28 MED ORDER — MAGNESIUM 400 MG PO TABS: ORAL_TABLET | ORAL | 1 refills | Status: AC

## 2024-01-28 MED ORDER — MAGNESIUM SULFATE 4 GM/100ML IV SOLN
4.0000 g | Freq: Once | INTRAVENOUS | Status: AC
  Administered 2024-01-28: 4 g via INTRAVENOUS
  Filled 2024-01-28: qty 100

## 2024-01-28 MED ORDER — CLONIDINE HCL 0.1 MG PO TABS
0.1000 mg | ORAL_TABLET | Freq: Two times a day (BID) | ORAL | Status: DC
  Administered 2024-01-28 (×2): 0.1 mg via ORAL
  Filled 2024-01-28 (×2): qty 1

## 2024-01-28 MED ORDER — LISINOPRIL 10 MG PO TABS
20.0000 mg | ORAL_TABLET | Freq: Two times a day (BID) | ORAL | Status: DC
  Administered 2024-01-28 (×2): 20 mg via ORAL
  Filled 2024-01-28 (×2): qty 2

## 2024-01-28 MED ORDER — ACETAMINOPHEN 325 MG PO TABS
650.0000 mg | ORAL_TABLET | Freq: Four times a day (QID) | ORAL | Status: DC | PRN
  Administered 2024-01-28: 650 mg via ORAL
  Filled 2024-01-28: qty 2

## 2024-01-28 MED ORDER — KETOROLAC TROMETHAMINE 30 MG/ML IJ SOLN
30.0000 mg | Freq: Once | INTRAMUSCULAR | Status: AC
  Administered 2024-01-28: 30 mg via INTRAVENOUS
  Filled 2024-01-28: qty 1

## 2024-01-28 MED ORDER — KETOROLAC TROMETHAMINE 15 MG/ML IJ SOLN
15.0000 mg | Freq: Once | INTRAMUSCULAR | Status: AC
  Administered 2024-01-28: 15 mg via INTRAVENOUS
  Filled 2024-01-28: qty 1

## 2024-01-28 MED ORDER — ASPIRIN 81 MG PO TBEC
81.0000 mg | DELAYED_RELEASE_TABLET | Freq: Every day | ORAL | Status: DC
  Administered 2024-01-28: 81 mg via ORAL
  Filled 2024-01-28: qty 1

## 2024-01-28 MED ORDER — ONDANSETRON HCL 4 MG/2ML IJ SOLN: 4.0000 mg | Freq: Four times a day (QID) | INTRAMUSCULAR | Status: DC | PRN

## 2024-01-28 MED ORDER — FERROUS SULFATE 325 (65 FE) MG PO TABS
325.0000 mg | ORAL_TABLET | Freq: Every day | ORAL | Status: DC
  Administered 2024-01-28: 325 mg via ORAL
  Filled 2024-01-28: qty 1

## 2024-01-28 MED ORDER — FERROUS SULFATE 325 (65 FE) MG PO TBEC
325.0000 mg | DELAYED_RELEASE_TABLET | Freq: Every day | ORAL | Status: DC
  Filled 2024-01-28: qty 1

## 2024-01-28 MED ORDER — MAGNESIUM HYDROXIDE 400 MG/5ML PO SUSP: 30.0000 mL | Freq: Every day | ORAL | Status: DC | PRN

## 2024-01-28 MED ORDER — PROCHLORPERAZINE EDISYLATE 10 MG/2ML IJ SOLN
10.0000 mg | Freq: Once | INTRAMUSCULAR | Status: AC
  Administered 2024-01-28: 10 mg via INTRAVENOUS
  Filled 2024-01-28: qty 2

## 2024-01-28 MED ORDER — AMLODIPINE BESYLATE 5 MG PO TABS
10.0000 mg | ORAL_TABLET | Freq: Every day | ORAL | Status: DC
  Administered 2024-01-28: 10 mg via ORAL
  Filled 2024-01-28: qty 2

## 2024-01-28 MED ORDER — NITROGLYCERIN 2 % TD OINT: 1.0000 [in_us] | TOPICAL_OINTMENT | Freq: Four times a day (QID) | TRANSDERMAL | Status: DC | PRN

## 2024-01-28 NOTE — ED Notes (Signed)
 Messaged MD about patient's headache now a 6/10. BP currently: 163/85 MAP:107. Asked MD if they still wanted to DC patient.

## 2024-01-28 NOTE — Assessment & Plan Note (Signed)
-   The patient will be placed in an observation progressive unit bed. - Will manage with as needed IV labetalol  and hydralazine . - Will continue her antihypertensive therapy. - Will decrease clonidine  1 p.o. 3 times daily to avoid rebound phenomenon.

## 2024-01-28 NOTE — Assessment & Plan Note (Addendum)
-   Potassium will be replaced. - Magnesium level will be checked

## 2024-01-28 NOTE — ED Notes (Signed)
 Assisted pt in hospital bed, changed into gown. Pt now resting comfortable. Denies any pain after pain medication given

## 2024-01-28 NOTE — Hospital Course (Addendum)
 Partly taken from H&P.  Rondell D Uncapher is a 73 y.o. African-American female with medical history significant for anxiety, depression, glaucoma, hypertension, anemia, osteoarthritis, morbid obesity, OSA on CPAP nightly who presented to the emergency room with elevated blood pressure with associated headache. No paresthesia or focal deficit.  No nausea or vomiting.  On presentation blood pressure elevated at 216/122, labs mostly stable except hypokalemia at 3.2.  Troponin 19>18>>13  Patient received a dose of labetalol .  8/24: Vital stable with blood pressure within goal.  Worsening hypokalemia with potassium at 2.8 and magnesium  of 1.2.  Electrolytes were repleted and patient was started on p.o. supplement for home.  Her symptoms and headache resolved.  Patient wants to go home. She was instructed to have a close follow-up with primary care provider for further assistance.  Patient has pretty resistant hypertension taking multiple antihypertensives and seems like some chronic hypokalemia-will need further investigation for the secondary causes of hypertension by PCP.  She will continue with her home medications and follow-up closely with her providers for further assistance.

## 2024-01-28 NOTE — Assessment & Plan Note (Signed)
-   Will continue diuretic therapy.

## 2024-01-28 NOTE — ED Notes (Signed)
 Patient sat up in bed to eat breakfast.

## 2024-01-28 NOTE — ED Notes (Signed)
 Messaged MD about patients increasing anxiety and hypertension with headache. Hydralazine  10mg  given for BP of 186/91 and BP is now 208/90 with MAP of 120. And tylenol  was given for headache. Non-medication techniques were tried and the patient states they have not helped and is now requesting something for anxiety.

## 2024-01-28 NOTE — Assessment & Plan Note (Signed)
-   We will continue ferrous sulfate. ?

## 2024-01-28 NOTE — H&P (Signed)
 [facility]   PATIENT NAME: Angie Webb    MR#:  [id number]  DATE OF BIRTH:  11-29-1950  DATE OF ADMISSION:  01/27/2024  PRIMARY CARE PHYSICIAN: [facility]   Patient is coming from: Home  REQUESTING/REFERRING PHYSICIAN: Cyrena Mylar, MD  CHIEF COMPLAINT:   Chief Complaint  Patient presents with   Headache   Hypertension    HISTORY OF PRESENT ILLNESS:  Angie Webb is a 73 y.o. African-American female with medical history significant for anxiety, depression, glaucoma, hypertension, anemia, osteoarthritis, morbid obesity, OSA on CPAP nightly who presented to the emergency room milligrams elevated blood pressure with associated headache with initial improvement followed by worsening.  No paresthesias or focal muscle weakness.  No nausea or vomiting or abdominal pain.  The patient was given a dose of IV labetalol  in the ER.  No chest pain or palpitations.  No cough or wheezing or hemoptysis.  No bleeding diathesis.  ED Course: When the patient came to the ER with 3.2 blood chloride 97 with glucose of 111.  GFR was more than 60.  High sensitive troponin I was 19 and later 18.  CBC was within normal. Imaging: 2 view chest x-ray showed no acute cardiopulmonary disease.  Noncontrast head CT scan revealed no acute intracranial normality.  The patient was given 4 mg of IV morphine  sulfate and 10 mg of IV Reglan , 20 mg of p.o. Zestril , 100 mg of p.o. labetalol , 20 mg of IV labetalol  and 15 mg of IV Toradol  in addition to 1 g of p.o. Tylenol .  She will be admitted to a progressive unit observation bed for further evaluation and management.   PAST MEDICAL HISTORY:   Past Medical History:  Diagnosis Date   Anemia    Anxiety    Bilateral knee pain    CHF (congestive heart failure) (HCC) 06/06/1997   Pt indicated hx of CHF since 1999   Chronic hepatitis C without hepatic coma (HCC)    Depression    Depression with anxiety    Glaucoma    Hx of hepatitis C 02/18/2015    Overview:  Started on Harvoni 03/26/14.  HepC GT 1a. Viral Load 390K. Liver elastrography:  F0. SVR at 24 weeks (resolved infection) Followed by [facility] Dr. Bernardino. S/P Harvoni treatments 2015-2016. History of IV drug use, illicit drug use.    Hypertension    Pt indicated hx of hypertension   Impaired fasting glucose 10/15/2015   Morbid obesity with BMI of 50.0-59.9, adult (HCC)    Primary osteoarthritis involving multiple joints    Sleep apnea    wears cpap to sleep    PAST SURGICAL HISTORY:   Past Surgical History:  Procedure Laterality Date   ABDOMINAL HYSTERECTOMY  04/1999   total   BREAST BIOPSY  6/11 & 6/13    SOCIAL HISTORY:   Social History   Tobacco Use   Smoking status: Former    Current packs/day: 0.00    Average packs/day: 1 pack/day for 30.0 years (30.0 ttl pk-yrs)    Types: Cigarettes    Start date: 30    Quit date: 2007    Years since quitting: 18.6   Smokeless tobacco: Never  Substance Use Topics   Alcohol use: No    Alcohol/week: 0.0 standard drinks of alcohol    FAMILY HISTORY:   Family History  Problem Relation Age of Onset   Cancer Father    Hypertension Mother    Constipation Mother  Liver disease Brother     DRUG ALLERGIES:   Allergies  Allergen Reactions   Other Other (See Comments) and Shortness Of Breath    beesting - eye swelling   Bee Venom     Bees    REVIEW OF SYSTEMS:   ROS As per history of present illness. All pertinent systems were reviewed above. Constitutional, HEENT, cardiovascular, respiratory, GI, GU, musculoskeletal, neuro, psychiatric, endocrine, integumentary and hematologic systems were reviewed and are otherwise negative/unremarkable except for positive findings mentioned above in the HPI.   MEDICATIONS AT HOME:   Prior to Admission medications   Medication Sig Start Date End Date Taking? Authorizing Provider  cloNIDine  (CATAPRES ) 0.1 MG tablet Take 0.1 mg by mouth 2 (two) times daily. 07/11/16  Yes  [provider]  acetaminophen  (TYLENOL ) 500 MG tablet Take 1,000 mg by mouth every 6 (six) hours as needed for mild pain (pain score 1-3) or moderate pain (pain score 4-6). 07/14/23   [provider]  aspirin  EC 81 MG tablet Take 81 mg by mouth daily. 09/03/08   [provider]  diclofenac  sodium (VOLTAREN ) 1 % GEL Apply topically as needed. 07/11/16   [provider]  dorzolamide -timolol  (COSOPT ) 2-0.5 % ophthalmic solution Place 1 drop into both eyes 2 (two) times daily.    [provider]  ferrous sulfate  325 (65 FE) MG EC tablet Take 325 mg by mouth daily with breakfast.    [provider]  fluticasone  (FLONASE ) 50 MCG/ACT nasal spray Place 2 sprays into both nostrils daily. Patient taking differently: Place 2 sprays into both nostrils daily as needed for allergies. 01/30/18   Poulose, Almarie BRAVO, NP  hydrochlorothiazide  (HYDRODIURIL ) 25 MG tablet Take 25 mg by mouth every other day. Alternating with Torsemide  20 mg every other day 07/11/16 10/25/23  [provider]  labetalol  (NORMODYNE ) 100 MG tablet Take 1 tablet (100 mg total) by mouth 2 (two) times daily. 04/26/16   Lada, Melinda P, MD  latanoprost  (XALATAN ) 0.005 % ophthalmic solution Place 1 drop into both eyes at bedtime.    [provider]  lisinopril  (PRINIVIL ,ZESTRIL ) 20 MG tablet Take 20 mg by mouth 2 (two) times daily. 07/11/16 10/25/23  [provider]  loratadine  (CLARITIN  REDITABS) 10 MG dissolvable tablet Take 10 mg by mouth daily.    [provider]  Misc. Devices [facility]) MISC 1 Device by Does not apply route daily. 03/03/15   Sundaram, Ashany, MD  nystatin  cream (MYCOSTATIN ) Apply topically 2 (two) times daily as needed for dry skin. To affected area(s) 01/25/16   Lada, Newell SQUIBB, MD  omeprazole  (PRILOSEC) 20 MG capsule Take 1 capsule (20 mg total) by mouth daily. 09/14/16   Hinton Newell SQUIBB, MD  Saccharomyces boulardii (PROBIOTIC) 250 MG CAPS Take  1 capsule by mouth in the morning and at bedtime. Patient not taking: Reported on 09/22/2023 01/28/21   Lang Dover, MD  timolol  (TIMOPTIC ) 0.5 % ophthalmic solution  01/26/15   [provider]  torsemide  (DEMADEX ) 20 MG tablet Take 20 mg by mouth every other day. Alternating with Hydrochlorothiazide  25 mg every other day 02/06/15   [provider]  VITAMIN D  PO Take 1 tablet by mouth daily.    [provider]      VITAL SIGNS:  Blood pressure (!) 163/95, pulse 87, temperature 98.8 F (37.1 C), temperature source Oral, resp. rate 17, SpO2 96%.  PHYSICAL EXAMINATION:  Physical Exam  GENERAL:  73 y.o.-year-old African-American female patient lying in the  bed with no acute distress.  EYES: Pupils equal, round, reactive to light and accommodation. No scleral icterus. Extraocular muscles intact.  HEENT: Head atraumatic, normocephalic. Oropharynx and nasopharynx clear.  NECK:  Supple, no jugular venous distention. No thyroid enlargement, no tenderness.  LUNGS: Normal breath sounds bilaterally, no wheezing, rales,rhonchi or crepitation. No use of accessory muscles of respiration.  CARDIOVASCULAR: Regular rate and rhythm, S1, S2 normal. No murmurs, rubs, or gallops.  ABDOMEN: Soft, nondistended, nontender. Bowel sounds present. No organomegaly or mass.  EXTREMITIES: No pedal edema, cyanosis, or clubbing.  NEUROLOGIC: Cranial nerves II through XII are intact. Muscle strength 5/5 in all extremities. Sensation intact. Gait not checked.  PSYCHIATRIC: The patient is alert and oriented x 3.  Normal affect and good eye contact. SKIN: No obvious rash, lesion, or ulcer.   LABORATORY PANEL:   CBC Recent Labs  Lab 01/27/24 2146  WBC 7.8  HGB 12.9  HCT 40.9  PLT 354   ------------------------------------------------------------------------------------------------------------------  Chemistries  Recent Labs  Lab 01/27/24 2146  NA 139  K 3.2*  CL 97*  CO2 29   GLUCOSE 111*  BUN 11  CREATININE 0.91  CALCIUM 9.0   ------------------------------------------------------------------------------------------------------------------  Cardiac Enzymes No results for input(s): TROPONINI in the last 168 hours. ------------------------------------------------------------------------------------------------------------------  RADIOLOGY:  CT Head Wo Contrast Result Date: 01/27/2024 CLINICAL DATA:  Headache, sudden, severe headache, hypertension EXAM: CT HEAD WITHOUT CONTRAST TECHNIQUE: Contiguous axial images were obtained from the base of the skull through the vertex without intravenous contrast. RADIATION DOSE REDUCTION: This exam was performed according to the departmental dose-optimization program which includes automated exposure control, adjustment of the mA and/or kV according to patient size and/or use of iterative reconstruction technique. COMPARISON:  CT head 01/18/2005 FINDINGS: Brain: No evidence of large-territorial acute infarction. No parenchymal hemorrhage. No mass lesion. No extra-axial collection. No mass effect or midline shift. No hydrocephalus. Basilar cisterns are patent. Vascular: No hyperdense vessel. Atherosclerotic calcifications are present within the cavernous internal carotid arteries. Skull: No acute fracture or focal lesion. Sinuses/Orbits: Paranasal sinuses and mastoid air cells are clear. The orbits are unremarkable. Other: None. IMPRESSION: No acute intracranial abnormality. Electronically Signed   By: Morgane  Naveau M.D.   On: 01/27/2024 19:37   DG Chest 2 View Result Date: 01/27/2024 CLINICAL DATA:  Headache. EXAM: CHEST - 2 VIEW COMPARISON:  September 22, 2023 FINDINGS: The heart size and mediastinal contours are within normal limits. Low lung volumes are noted. Mild atelectatic changes are suspected within the right lung base. No focal consolidation, pleural effusion or pneumothorax is identified. Multilevel degenerative changes are  seen throughout the thoracic spine. IMPRESSION: No active cardiopulmonary disease. Electronically Signed   By: Suzen Dials M.D.   On: 01/27/2024 19:33      IMPRESSION AND PLAN:  Assessment and Plan: * Hypertensive urgency - The patient will be placed in an observation progressive unit bed. - Will manage with as needed IV labetalol  and hydralazine . - Will continue her antihypertensive therapy. - Will decrease clonidine  1 p.o. 3 times daily to avoid rebound phenomenon.  Hypokalemia Potassium will be replaced.  Magnesium  level will be checked.  Chronic diastolic CHF (congestive heart failure) (HCC) - Will continue diuretic therapy.  Anemia - We will continue ferrous sulfate .   DVT prophylaxis: Lovenox .  Advanced Care Planning:  Code Status: full code.  Family Communication:  The plan of care was discussed in details with the patient (and family). I answered all questions. The patient agreed to proceed with the  above mentioned plan. Further management will depend upon hospital course. Disposition Plan: Back to previous home environment Consults called: none. All the records are reviewed and case discussed with ED provider.  Status is: Observation The patient remains OBS appropriate and will d/c before 2 midnights. I certify that at the time of admission, it is my clinical judgment that the patient will require hospital care extending less than 2 midnights.                            Dispo: The patient is from: Home              Anticipated d/c is to: Home              Patient currently is not medically stable to d/c.              Difficult to place patient: No  Madison DELENA Peaches M.D on 01/28/2024 at 4:18 AM  [facility]   From 7 PM-7 AM, contact night-coverage [email or web address]  CC: Primary care physician; [facility]

## 2024-01-28 NOTE — ED Provider Notes (Addendum)
 Blood pressure markedly elevated multiple checks over 200 systolic.  Headache initially better but now worsened again.  Give headache medications now.  Given IV dose of labetalol .  Pending second troponin, no chest pain doubt cardiac ischemia.  Will admit for hypertensive crisis given her marked hypertension and mildly increased troponin evidence of endorgan damage..  I doubt temporal arteritis given no vision changes no temporal tenderness to palpation.    PROCEDURES:  Critical Care performed: Yes, see critical care procedure note(s)  .Critical Care  Performed by: Cyrena Mylar, MD Authorized by: Cyrena Mylar, MD   Critical care provider statement:    Critical care time (minutes):  30   Critical care was time spent personally by me on the following activities:  Development of treatment plan with patient or surrogate, discussions with consultants, evaluation of patient's response to treatment, examination of patient, ordering and review of laboratory studies, ordering and review of radiographic studies, ordering and performing treatments and interventions, pulse oximetry, re-evaluation of patient's condition and review of old charts       Cyrena Mylar, MD 01/28/24 BRYN    Cyrena Mylar, MD 01/28/24 [id number]

## 2024-01-28 NOTE — ED Notes (Signed)
 Answered call light, pt advised needs to use the bathroom, disconnected pt from the monitor and handed her the walker per her request. Ask pt if she needed assistance to the bathroom, pt advised no and to leave the door open. Advised pt if she needs assistance while in the bathroom to pull the white cord.

## 2024-01-28 NOTE — Discharge Summary (Addendum)
 Physician Discharge Summary   Patient: Angie Webb MRN: [id number] DOB: 01/05/1951  Admit date:     01/27/2024  Discharge date: 01/28/24  Discharge Physician: Amaryllis Dare   PCP: [facility]   Recommendations at discharge:  Please obtain CBC, BMP and magnesium  level on discharge Patient need investigations for secondary causes of resistant hypertension. Follow-up with primary care provider within a week.  Discharge Diagnoses: Principal Problem:   Hypertensive urgency Active Problems:   Hypokalemia   Hypertension   Anemia   Chronic diastolic CHF (congestive heart failure) (HCC)   Acute nonintractable headache   Troponin level elevated   Hospital Course: Partly taken from H&P.  Angie Webb is a 73 y.o. African-American female with medical history significant for anxiety, depression, glaucoma, hypertension, anemia, osteoarthritis, morbid obesity, OSA on CPAP nightly who presented to the emergency room with elevated blood pressure with associated headache. No paresthesia or focal deficit.  No nausea or vomiting.  On presentation blood pressure elevated at 216/122, labs mostly stable except hypokalemia at 3.2.  Troponin 19>18>>13  Patient received a dose of labetalol .  8/24: Vital stable with blood pressure within goal.  Worsening hypokalemia with potassium at 2.8 and magnesium  of 1.2.  Electrolytes were repleted and patient was started on p.o. supplement for home.  Her symptoms and headache resolved.  Patient wants to go home. She was instructed to have a close follow-up with primary care provider for further assistance.  Patient has pretty resistant hypertension taking multiple antihypertensives and seems like some chronic hypokalemia-will need further investigation for the secondary causes of hypertension by PCP.  As blood pressure started trending up again so we added amlodipine  to her home regimen.  Patient also received 1 dose of migraine cocktail.  She  will continue with her home medications and follow-up closely with her providers for further assistance.  Assessment and Plan: * Hypertensive urgency Resolved and blood pressure is now within goal.  Per patient she was taking her medications regularly, headache might have triggered. - Continuing current medication - Needed close follow-up with PCP  Hypokalemia Hypomagnesemia. Electrolytes were repleted and she was started on p.o. supplement.  PCP need to follow-up  Chronic diastolic CHF (congestive heart failure) (HCC) - Will continue diuretic therapy.  Anemia - We will continue ferrous sulfate .  Consultants: None Procedures performed: None Disposition: Home Diet recommendation:  Discharge Diet Orders (From admission, onward)     Start     Ordered   01/28/24 0000  Diet - low sodium heart healthy        01/28/24 1036           Cardiac diet DISCHARGE MEDICATION: Allergies as of 01/28/2024       Reactions   Other Other (See Comments), Shortness Of Breath   beesting - eye swelling   Bee Venom    Bees        Medication List     TAKE these medications    acetaminophen  500 MG tablet Commonly known as: TYLENOL  Take 1,000 mg by mouth every 6 (six) hours as needed for mild pain (pain score 1-3) or moderate pain (pain score 4-6).   amLODipine  10 MG tablet Commonly known as: NORVASC  Take 1 tablet (10 mg total) by mouth daily.   aspirin  EC 81 MG tablet Take 81 mg by mouth daily.   cloNIDine  0.1 MG tablet Commonly known as: CATAPRES  Take 0.1 mg by mouth 2 (two) times daily.   diclofenac  sodium 1 % Gel Commonly known  as: VOLTAREN  Apply topically as needed.   dorzolamide -timolol  2-0.5 % ophthalmic solution Commonly known as: COSOPT  Place 1 drop into both eyes 2 (two) times daily.   ferrous sulfate  325 (65 FE) MG EC tablet Take 325 mg by mouth daily with breakfast.   fluticasone  50 MCG/ACT nasal spray Commonly known as: FLONASE  Place 2 sprays into both  nostrils daily. What changed:  when to take this reasons to take this   hydrochlorothiazide  25 MG tablet Commonly known as: HYDRODIURIL  Take 25 mg by mouth every other day. Alternating with Torsemide  20 mg every other day   labetalol  100 MG tablet Commonly known as: NORMODYNE  Take 1 tablet (100 mg total) by mouth 2 (two) times daily.   latanoprost  0.005 % ophthalmic solution Commonly known as: XALATAN  Place 1 drop into both eyes at bedtime.   lisinopril  20 MG tablet Commonly known as: ZESTRIL  Take 20 mg by mouth 2 (two) times daily.   loratadine  10 MG dissolvable tablet Commonly known as: CLARITIN  REDITABS Take 10 mg by mouth daily.   Magnesium  400 MG Tabs Take 1 tablet daily   nystatin  cream Commonly known as: MYCOSTATIN  Apply topically 2 (two) times daily as needed for dry skin. To affected area(s)   omeprazole  20 MG capsule Commonly known as: PRILOSEC Take 1 capsule (20 mg total) by mouth daily.   potassium chloride  SA 20 MEQ tablet Commonly known as: KLOR-CON  M Take 1 tablet (20 mEq total) by mouth daily.   timolol  0.5 % ophthalmic solution Commonly known as: TIMOPTIC    torsemide  20 MG tablet Commonly known as: DEMADEX  Take 20 mg by mouth every other day. Alternating with Hydrochlorothiazide  25 mg every other day   VITAMIN D  PO Take 1 tablet by mouth daily.   Wheelchair Misc 1 Device by Does not apply route daily.        Follow-up Information     [facility] Schedule an appointment as soon as possible for a visit in 1 week(s).   Contact information: [address] [place] [address] [phone]                Discharge Exam: There were no vitals filed for this visit. General.  Morbidly obese lady, in no acute distress. Pulmonary.  Lungs clear bilaterally, normal respiratory effort. CV.  Regular rate and rhythm, no JVD, rub or murmur. Abdomen.  Soft, nontender, nondistended, BS positive. CNS.  Alert and oriented .  No focal  neurologic deficit. Extremities.  No edema, no cyanosis, pulses intact and symmetrical. Psychiatry.  Judgment and insight appears normal.   Condition at discharge: stable  The results of significant diagnostics from this hospitalization (including imaging, microbiology, ancillary and laboratory) are listed below for reference.   Imaging Studies: CT Head Wo Contrast Result Date: 01/27/2024 CLINICAL DATA:  Headache, sudden, severe headache, hypertension EXAM: CT HEAD WITHOUT CONTRAST TECHNIQUE: Contiguous axial images were obtained from the base of the skull through the vertex without intravenous contrast. RADIATION DOSE REDUCTION: This exam was performed according to the departmental dose-optimization program which includes automated exposure control, adjustment of the mA and/or kV according to patient size and/or use of iterative reconstruction technique. COMPARISON:  CT head 01/18/2005 FINDINGS: Brain: No evidence of large-territorial acute infarction. No parenchymal hemorrhage. No mass lesion. No extra-axial collection. No mass effect or midline shift. No hydrocephalus. Basilar cisterns are patent. Vascular: No hyperdense vessel. Atherosclerotic calcifications are present within the cavernous internal carotid arteries. Skull: No acute fracture or focal lesion. Sinuses/Orbits: Paranasal  sinuses and mastoid air cells are clear. The orbits are unremarkable. Other: None. IMPRESSION: No acute intracranial abnormality. Electronically Signed   By: Morgane  Naveau M.D.   On: 01/27/2024 19:37   DG Chest 2 View Result Date: 01/27/2024 CLINICAL DATA:  Headache. EXAM: CHEST - 2 VIEW COMPARISON:  September 22, 2023 FINDINGS: The heart size and mediastinal contours are within normal limits. Low lung volumes are noted. Mild atelectatic changes are suspected within the right lung base. No focal consolidation, pleural effusion or pneumothorax is identified. Multilevel degenerative changes are seen throughout the thoracic  spine. IMPRESSION: No active cardiopulmonary disease. Electronically Signed   By: Suzen Dials M.D.   On: 01/27/2024 19:33    Microbiology: Results for orders placed or performed during the hospital encounter of 06/29/23  Resp panel by RT-PCR (RSV, Flu A&B, Covid) Anterior Nasal Swab     Status: None   Collection Time: 06/29/23  2:41 PM   Specimen: Anterior Nasal Swab  Result Value Ref Range Status   SARS Coronavirus 2 by RT PCR NEGATIVE NEGATIVE Final    Comment: (NOTE) SARS-CoV-2 target nucleic acids are NOT DETECTED.  The SARS-CoV-2 RNA is generally detectable in upper respiratory specimens during the acute phase of infection. The lowest concentration of SARS-CoV-2 viral copies this assay can detect is 138 copies/mL. A negative result does not preclude SARS-Cov-2 infection and should not be used as the sole basis for treatment or other patient management decisions. A negative result may occur with  improper specimen collection/handling, submission of specimen other than nasopharyngeal swab, presence of viral mutation(s) within the areas targeted by this assay, and inadequate number of viral copies(<138 copies/mL). A negative result must be combined with clinical observations, patient history, and epidemiological information. The expected result is Negative.  Fact Sheet for Patients:  BloggerCourse.com  Fact Sheet for Healthcare Providers:  SeriousBroker.it  This test is no t yet approved or cleared by the [place]  FDA and  has been authorized for detection and/or diagnosis of SARS-CoV-2 by FDA under an Emergency Use Authorization (EUA). This EUA will remain  in effect (meaning this test can be used) for the duration of the COVID-19 declaration under Section 564(b)(1) of the Act, 21 U.S.C.section 360bbb-3(b)(1), unless the authorization is terminated  or revoked sooner.       Influenza A by PCR NEGATIVE NEGATIVE  Final   Influenza B by PCR NEGATIVE NEGATIVE Final    Comment: (NOTE) The Xpert Xpress SARS-CoV-2/FLU/RSV plus assay is intended as an aid in the diagnosis of influenza from Nasopharyngeal swab specimens and should not be used as a sole basis for treatment. Nasal washings and aspirates are unacceptable for Xpert Xpress SARS-CoV-2/FLU/RSV testing.  Fact Sheet for Patients: BloggerCourse.com  Fact Sheet for Healthcare Providers: SeriousBroker.it  This test is not yet approved or cleared by the [place]  FDA and has been authorized for detection and/or diagnosis of SARS-CoV-2 by FDA under an Emergency Use Authorization (EUA). This EUA will remain in effect (meaning this test can be used) for the duration of the COVID-19 declaration under Section 564(b)(1) of the Act, 21 U.S.C. section 360bbb-3(b)(1), unless the authorization is terminated or revoked.     Resp Syncytial Virus by PCR NEGATIVE NEGATIVE Final    Comment: (NOTE) Fact Sheet for Patients: BloggerCourse.com  Fact Sheet for Healthcare Providers: SeriousBroker.it  This test is not yet approved or cleared by the [place]  FDA and has been authorized for detection and/or diagnosis of SARS-CoV-2 by FDA under an  Emergency Use Authorization (EUA). This EUA will remain in effect (meaning this test can be used) for the duration of the COVID-19 declaration under Section 564(b)(1) of the Act, 21 U.S.C. section 360bbb-3(b)(1), unless the authorization is terminated or revoked.  Performed at [facility], [address] [place], [place] [address]     Labs: CBC: Recent Labs  Lab 01/27/24 2146 01/28/24 0552  WBC 7.8 6.1  HGB 12.9 11.9*  HCT 40.9 36.9  MCV 89.3 89.6  PLT 354 290   Basic Metabolic Panel: Recent Labs  Lab 01/27/24 2146 01/28/24 0552  NA 139 138  K 3.2* 2.8*  CL 97* 99  CO2 29 26   GLUCOSE 111* 149*  BUN 11 12  CREATININE 0.91 0.97  CALCIUM 9.0 8.3*  MG  --  1.2*   Liver Function Tests: No results for input(s): AST, ALT, ALKPHOS, BILITOT, PROT, ALBUMIN in the last 168 hours. CBG: No results for input(s): GLUCAP in the last 168 hours.  Discharge time spent: greater than 30 minutes.  This record has been created using Conservation officer, historic buildings. Errors have been sought and corrected,but may not always be located. Such creation errors do not reflect on the standard of care.   Signed: Amaryllis Dare, MD [facility] 01/28/2024

## 2024-01-28 NOTE — ED Notes (Signed)
 Fall bundle is in place
